# Patient Record
Sex: Female | Born: 1975 | Race: Black or African American | Hispanic: No | Marital: Married | Smoking: Never smoker
Health system: Southern US, Community
[De-identification: ages and names within clinical notes are randomized; demographics above are authoritative.]

## PROBLEM LIST (undated history)

## (undated) ENCOUNTER — Inpatient Hospital Stay (HOSPITAL_COMMUNITY): Payer: Self-pay

## (undated) DIAGNOSIS — D573 Sickle-cell trait: Secondary | ICD-10-CM

## (undated) DIAGNOSIS — M1990 Unspecified osteoarthritis, unspecified site: Secondary | ICD-10-CM

## (undated) DIAGNOSIS — K219 Gastro-esophageal reflux disease without esophagitis: Secondary | ICD-10-CM

## (undated) HISTORY — DX: Sickle-cell trait: D57.3

## (undated) HISTORY — DX: Gastro-esophageal reflux disease without esophagitis: K21.9

## (undated) HISTORY — DX: Unspecified osteoarthritis, unspecified site: M19.90

## (undated) HISTORY — DX: Morbid (severe) obesity due to excess calories: E66.01

---

## 2013-05-21 LAB — OB RESULTS CONSOLE GC/CHLAMYDIA
Chlamydia: NEGATIVE
Gonorrhea: NEGATIVE

## 2013-05-21 LAB — OB RESULTS CONSOLE HEPATITIS B SURFACE ANTIGEN: Hepatitis B Surface Ag: NEGATIVE

## 2013-05-21 LAB — SICKLE CELL SCREEN

## 2013-05-21 LAB — OB RESULTS CONSOLE ABO/RH

## 2013-05-21 LAB — OB RESULTS CONSOLE HGB/HCT, BLOOD: HCT: 38 %

## 2013-05-21 LAB — OB RESULTS CONSOLE PLATELET COUNT: Platelets: 196 10*3/uL

## 2013-05-21 LAB — OB RESULTS CONSOLE ANTIBODY SCREEN: Antibody Screen: NEGATIVE

## 2013-06-07 ENCOUNTER — Inpatient Hospital Stay (HOSPITAL_COMMUNITY): Admission: AD | Admit: 2013-06-07 | Payer: Self-pay | Source: Ambulatory Visit | Admitting: Obstetrics & Gynecology

## 2013-07-12 ENCOUNTER — Encounter: Payer: Self-pay | Admitting: *Deleted

## 2013-07-16 ENCOUNTER — Encounter: Payer: Self-pay | Admitting: Obstetrics & Gynecology

## 2013-07-16 ENCOUNTER — Ambulatory Visit (INDEPENDENT_AMBULATORY_CARE_PROVIDER_SITE_OTHER): Payer: Self-pay | Admitting: Obstetrics & Gynecology

## 2013-07-16 VITALS — BP 141/82 | Temp 97.2°F | Ht 64.0 in | Wt 235.6 lb

## 2013-07-16 DIAGNOSIS — O09212 Supervision of pregnancy with history of pre-term labor, second trimester: Secondary | ICD-10-CM

## 2013-07-16 DIAGNOSIS — O09219 Supervision of pregnancy with history of pre-term labor, unspecified trimester: Secondary | ICD-10-CM | POA: Insufficient documentation

## 2013-07-16 DIAGNOSIS — E669 Obesity, unspecified: Secondary | ICD-10-CM

## 2013-07-16 DIAGNOSIS — IMO0002 Reserved for concepts with insufficient information to code with codable children: Secondary | ICD-10-CM | POA: Insufficient documentation

## 2013-07-16 DIAGNOSIS — Z609 Problem related to social environment, unspecified: Secondary | ICD-10-CM

## 2013-07-16 DIAGNOSIS — O99212 Obesity complicating pregnancy, second trimester: Secondary | ICD-10-CM

## 2013-07-16 DIAGNOSIS — O09522 Supervision of elderly multigravida, second trimester: Secondary | ICD-10-CM

## 2013-07-16 DIAGNOSIS — O34219 Maternal care for unspecified type scar from previous cesarean delivery: Secondary | ICD-10-CM

## 2013-07-16 DIAGNOSIS — O99210 Obesity complicating pregnancy, unspecified trimester: Secondary | ICD-10-CM | POA: Insufficient documentation

## 2013-07-16 DIAGNOSIS — O09529 Supervision of elderly multigravida, unspecified trimester: Secondary | ICD-10-CM

## 2013-07-16 DIAGNOSIS — D573 Sickle-cell trait: Secondary | ICD-10-CM

## 2013-07-16 DIAGNOSIS — Z789 Other specified health status: Secondary | ICD-10-CM | POA: Insufficient documentation

## 2013-07-16 HISTORY — DX: Sickle-cell trait: D57.3

## 2013-07-16 LAB — POCT URINALYSIS DIP (DEVICE)
Bilirubin Urine: NEGATIVE
Glucose, UA: NEGATIVE mg/dL
Ketones, ur: NEGATIVE mg/dL
Nitrite: NEGATIVE
Specific Gravity, Urine: 1.015 (ref 1.005–1.030)
Urobilinogen, UA: 0.2 mg/dL (ref 0.0–1.0)
pH: 7 (ref 5.0–8.0)

## 2013-07-16 NOTE — Progress Notes (Signed)
Transfer from [facility] for PPROM/PTD at 33 weeks.  Candidate for 17P, patient wishes to get this but does not have insurance.  She is willing to apply for the patient assistance program. Patient desires TOLAC, consent given to her to review at home, will sign at next visit.  Recommended anatomy scan, patient does not want to get this in the hospital 2/2 cost.  Will get it somewhere and send the results.  Need quad screen results from [facility]. No other complaints or concerns.  Routine obstetric precautions reviewed.

## 2013-07-16 NOTE — Patient Instructions (Signed)
Return to clinic for any obstetric concerns or go to MAU for evaluation  

## 2013-07-16 NOTE — Progress Notes (Signed)
Pulse-  75 New ob packet given Flu vaccine given @ [facility]

## 2013-07-16 NOTE — Progress Notes (Signed)
Nutrition note: 1st visit consult Pt has gained 13.6# @ [redacted]w[redacted]d, which is > expected so far. Pt reports eating 3 meals & 1-2 snacks/d. Pt is taking PNV. Pt reports no N/V or heartburn. NKFA. Pt received verbal & written education on general nutrition during pregnancy. Discussed portion sizes.  Discussed wt gain goals of 11-20# or 0.5#/wk. Pt agrees to continue taking PNV. Pt has WIC and plans to BF. F/u if referred Blondell Reveal, MS, RD, LDN, [facility]

## 2013-07-20 ENCOUNTER — Encounter: Payer: Self-pay | Admitting: *Deleted

## 2013-07-20 DIAGNOSIS — O34219 Maternal care for unspecified type scar from previous cesarean delivery: Secondary | ICD-10-CM

## 2013-07-30 ENCOUNTER — Encounter: Payer: Self-pay | Admitting: Advanced Practice Midwife

## 2013-07-31 ENCOUNTER — Ambulatory Visit (INDEPENDENT_AMBULATORY_CARE_PROVIDER_SITE_OTHER): Payer: Self-pay | Admitting: Obstetrics & Gynecology

## 2013-07-31 VITALS — BP 116/70 | Temp 97.7°F | Wt 239.1 lb

## 2013-07-31 DIAGNOSIS — O09892 Supervision of other high risk pregnancies, second trimester: Secondary | ICD-10-CM

## 2013-07-31 DIAGNOSIS — O09219 Supervision of pregnancy with history of pre-term labor, unspecified trimester: Secondary | ICD-10-CM

## 2013-07-31 LAB — POCT URINALYSIS DIP (DEVICE)
Bilirubin Urine: NEGATIVE
Glucose, UA: NEGATIVE mg/dL
Hgb urine dipstick: NEGATIVE
Ketones, ur: NEGATIVE mg/dL
Leukocytes, UA: NEGATIVE
Nitrite: NEGATIVE
Specific Gravity, Urine: 1.015 (ref 1.005–1.030)
pH: 7.5 (ref 5.0–8.0)

## 2013-07-31 MED ORDER — HYDROXYPROGESTERONE CAPROATE 250 MG/ML IM OIL
250.0000 mg | TOPICAL_OIL | INTRAMUSCULAR | Status: AC
  Administered 2013-07-31 – 2013-10-24 (×13): 250 mg via INTRAMUSCULAR

## 2013-07-31 NOTE — Patient Instructions (Addendum)
Preterm Labor Information Preterm labor is when labor starts at less than 37 weeks of pregnancy. The normal length of a pregnancy is 39 to 41 weeks. CAUSES Often, there is no identifiable underlying cause as to why a woman goes into preterm labor. One of the most common known causes of preterm labor is infection. Infections of the uterus, cervix, vagina, amniotic sac, bladder, kidney, or even the lungs (pneumonia) can cause labor to start. Other suspected causes of preterm labor include:   Urogenital infections, such as yeast infections and bacterial vaginosis.   Uterine abnormalities (uterine shape, uterine septum, fibroids, or bleeding from the placenta).   A cervix that has been operated on (it may fail to stay closed).   Malformations in the fetus.   Multiple gestations (twins, triplets, and so on).   Breakage of the amniotic sac.  RISK FACTORS  Having a previous history of preterm labor.   Having premature rupture of membranes (PROM).   Having a placenta that covers the opening of the cervix (placenta previa).   Having a placenta that separates from the uterus (placental abruption).   Having a cervix that is too weak to hold the fetus in the uterus (incompetent cervix).   Having too much fluid in the amniotic sac (polyhydramnios).   Pt given written booklet on 17- OH-P   Taking illegal drugs or smoking while pregnant.   Not gaining enough weight while pregnant.   Being younger than 24 and older than 36 years old.   Having a low socioeconomic status.   Being African American. SYMPTOMS Signs and symptoms of preterm labor include:   Menstrual-like cramps, abdominal pain, or back pain.  Uterine contractions that are regular, as frequent as six in an hour, regardless of their intensity (may be mild or painful).  Contractions that start on the top of the uterus and spread down to the lower abdomen and back.   A sense of increased pelvic pressure.    A watery or bloody mucus discharge that comes from the vagina.  TREATMENT Depending on the length of the pregnancy and other circumstances, your health care provider may suggest bed rest. If necessary, there are medicines that can be given to stop contractions and to mature the fetal lungs. If labor happens before 34 weeks of pregnancy, a prolonged hospital stay may be recommended. Treatment depends on the condition of both you and the fetus.  WHAT SHOULD YOU DO IF YOU THINK YOU ARE IN PRETERM LABOR? Call your health care provider right away. You will need to go to the hospital to get checked immediately. HOW CAN YOU PREVENT PRETERM LABOR IN FUTURE PREGNANCIES? You should:   Stop smoking if you smoke.  Maintain healthy weight gain and avoid chemicals and drugs that are not necessary.  Be watchful for any type of infection.  Inform your health care provider if you have a known history of preterm labor. Document Released: 11/13/2003 Document Revised: 04/25/2013 Document Reviewed: 09/25/2012 [facility] Patient Information 2014 [place], [place].

## 2013-07-31 NOTE — Progress Notes (Signed)
Pt with no complaints.  Reviewed 17- OH-P.  Pt ti start today F/u weekly for injections F/u 4 weeks for OV

## 2013-07-31 NOTE — Progress Notes (Signed)
P = 87 

## 2013-08-07 ENCOUNTER — Ambulatory Visit (INDEPENDENT_AMBULATORY_CARE_PROVIDER_SITE_OTHER): Payer: Self-pay | Admitting: *Deleted

## 2013-08-07 VITALS — BP 129/74 | HR 91 | Temp 97.7°F

## 2013-08-07 DIAGNOSIS — O09219 Supervision of pregnancy with history of pre-term labor, unspecified trimester: Secondary | ICD-10-CM

## 2013-08-07 DIAGNOSIS — O09212 Supervision of pregnancy with history of pre-term labor, second trimester: Secondary | ICD-10-CM

## 2013-08-14 ENCOUNTER — Ambulatory Visit (INDEPENDENT_AMBULATORY_CARE_PROVIDER_SITE_OTHER): Payer: Self-pay | Admitting: *Deleted

## 2013-08-14 VITALS — BP 132/73 | HR 88 | Temp 98.9°F

## 2013-08-14 DIAGNOSIS — O09219 Supervision of pregnancy with history of pre-term labor, unspecified trimester: Secondary | ICD-10-CM

## 2013-08-14 DIAGNOSIS — O09212 Supervision of pregnancy with history of pre-term labor, second trimester: Secondary | ICD-10-CM

## 2013-08-21 ENCOUNTER — Ambulatory Visit (INDEPENDENT_AMBULATORY_CARE_PROVIDER_SITE_OTHER): Payer: Self-pay | Admitting: *Deleted

## 2013-08-21 VITALS — BP 129/72 | HR 93

## 2013-08-21 DIAGNOSIS — O09212 Supervision of pregnancy with history of pre-term labor, second trimester: Secondary | ICD-10-CM

## 2013-08-21 DIAGNOSIS — O09219 Supervision of pregnancy with history of pre-term labor, unspecified trimester: Secondary | ICD-10-CM

## 2013-08-29 ENCOUNTER — Ambulatory Visit (INDEPENDENT_AMBULATORY_CARE_PROVIDER_SITE_OTHER): Payer: Self-pay | Admitting: Family

## 2013-08-29 VITALS — BP 118/67 | Wt 250.9 lb

## 2013-08-29 DIAGNOSIS — O09522 Supervision of elderly multigravida, second trimester: Secondary | ICD-10-CM

## 2013-08-29 DIAGNOSIS — O34219 Maternal care for unspecified type scar from previous cesarean delivery: Secondary | ICD-10-CM

## 2013-08-29 DIAGNOSIS — O09219 Supervision of pregnancy with history of pre-term labor, unspecified trimester: Secondary | ICD-10-CM

## 2013-08-29 DIAGNOSIS — O09529 Supervision of elderly multigravida, unspecified trimester: Secondary | ICD-10-CM

## 2013-08-29 LAB — POCT URINALYSIS DIP (DEVICE)
Bilirubin Urine: NEGATIVE
Hgb urine dipstick: NEGATIVE
Ketones, ur: NEGATIVE mg/dL

## 2013-08-29 NOTE — Progress Notes (Signed)
Pulse: 82

## 2013-08-29 NOTE — Progress Notes (Signed)
No ultrasound on file, pt states got it completed at Dr. Elsie Stain office > pt concerned for additional cost of ultrasound.  Due to increased fundal height recommended follow-up for growth.  17 p today.

## 2013-09-04 ENCOUNTER — Ambulatory Visit (INDEPENDENT_AMBULATORY_CARE_PROVIDER_SITE_OTHER): Payer: Self-pay

## 2013-09-04 ENCOUNTER — Ambulatory Visit (HOSPITAL_COMMUNITY): Admission: RE | Admit: 2013-09-04 | Payer: Self-pay | Source: Ambulatory Visit

## 2013-09-04 DIAGNOSIS — O09219 Supervision of pregnancy with history of pre-term labor, unspecified trimester: Secondary | ICD-10-CM

## 2013-09-04 DIAGNOSIS — O09212 Supervision of pregnancy with history of pre-term labor, second trimester: Secondary | ICD-10-CM

## 2013-09-05 ENCOUNTER — Ambulatory Visit: Payer: Self-pay

## 2013-09-12 ENCOUNTER — Ambulatory Visit (INDEPENDENT_AMBULATORY_CARE_PROVIDER_SITE_OTHER): Payer: Self-pay | Admitting: Advanced Practice Midwife

## 2013-09-12 ENCOUNTER — Encounter: Payer: Self-pay | Admitting: *Deleted

## 2013-09-12 VITALS — BP 139/76 | Temp 98.8°F | Wt 251.5 lb

## 2013-09-12 DIAGNOSIS — O09529 Supervision of elderly multigravida, unspecified trimester: Secondary | ICD-10-CM

## 2013-09-12 DIAGNOSIS — O09522 Supervision of elderly multigravida, second trimester: Secondary | ICD-10-CM

## 2013-09-12 DIAGNOSIS — Z23 Encounter for immunization: Secondary | ICD-10-CM

## 2013-09-12 DIAGNOSIS — O09219 Supervision of pregnancy with history of pre-term labor, unspecified trimester: Secondary | ICD-10-CM

## 2013-09-12 DIAGNOSIS — O09213 Supervision of pregnancy with history of pre-term labor, third trimester: Secondary | ICD-10-CM

## 2013-09-12 LAB — CBC
HCT: 33.8 % — ABNORMAL LOW (ref 36.0–46.0)
Hemoglobin: 11.1 g/dL — ABNORMAL LOW (ref 12.0–15.0)
MCH: 29.4 pg (ref 26.0–34.0)
MCHC: 32.8 g/dL (ref 30.0–36.0)
MCV: 89.7 fL (ref 78.0–100.0)
Platelets: 193 10*3/uL (ref 150–400)
RBC: 3.77 MIL/uL — ABNORMAL LOW (ref 3.87–5.11)
RDW: 14.9 % (ref 11.5–15.5)
WBC: 8.5 10*3/uL (ref 4.0–10.5)

## 2013-09-12 MED ORDER — TETANUS-DIPHTH-ACELL PERTUSSIS 5-2.5-18.5 LF-MCG/0.5 IM SUSP: 0.5000 mL | Freq: Once | INTRAMUSCULAR | Status: DC

## 2013-09-12 NOTE — Progress Notes (Signed)
Doing well.  Good fetal movement, denies vaginal bleeding, LOF, regular contractions.  Pt has URI, no fever.  Discussed safe medications, pt given list today. Increase PO fluids. GTT today.

## 2013-09-12 NOTE — Progress Notes (Signed)
P=95, [employer] tradernterpreter

## 2013-09-13 LAB — GLUCOSE TOLERANCE, 1 HOUR (50G) W/O FASTING: Glucose, 1 Hour GTT: 111 mg/dL (ref 70–140)

## 2013-09-13 LAB — RPR

## 2013-09-13 LAB — HIV ANTIBODY (ROUTINE TESTING W REFLEX): HIV: NONREACTIVE

## 2013-09-14 ENCOUNTER — Other Ambulatory Visit: Payer: Self-pay | Admitting: Family

## 2013-09-14 ENCOUNTER — Ambulatory Visit (INDEPENDENT_AMBULATORY_CARE_PROVIDER_SITE_OTHER): Payer: Self-pay | Admitting: *Deleted

## 2013-09-14 ENCOUNTER — Ambulatory Visit (HOSPITAL_COMMUNITY)
Admission: RE | Admit: 2013-09-14 | Discharge: 2013-09-14 | Disposition: A | Discharge status: Home / Self Care | Payer: Self-pay | Source: Ambulatory Visit | Attending: Family | Admitting: Family

## 2013-09-14 VITALS — BP 145/78 | HR 87 | Temp 98.0°F

## 2013-09-14 DIAGNOSIS — O26873 Cervical shortening, third trimester: Secondary | ICD-10-CM

## 2013-09-14 DIAGNOSIS — O99210 Obesity complicating pregnancy, unspecified trimester: Principal | ICD-10-CM

## 2013-09-14 DIAGNOSIS — E669 Obesity, unspecified: Secondary | ICD-10-CM | POA: Insufficient documentation

## 2013-09-14 DIAGNOSIS — O26879 Cervical shortening, unspecified trimester: Secondary | ICD-10-CM

## 2013-09-14 DIAGNOSIS — O09522 Supervision of elderly multigravida, second trimester: Secondary | ICD-10-CM

## 2013-09-14 DIAGNOSIS — Z3689 Encounter for other specified antenatal screening: Secondary | ICD-10-CM | POA: Insufficient documentation

## 2013-09-14 MED ORDER — BETAMETHASONE SOD PHOS & ACET 6 (3-3) MG/ML IJ SUSP
12.0000 mg | Freq: Once | INTRAMUSCULAR | Status: AC
  Administered 2013-09-14: 12 mg via INTRAMUSCULAR

## 2013-09-14 NOTE — Progress Notes (Signed)
Patient ID: Deborah Huang, female   DOB: 1976-04-15, 38 y.o.   MRN: [id number] Short cervix noted on US today, will receive betamethasone 12 mg IM today and tomorrow. Continue 17 P

## 2013-09-15 ENCOUNTER — Inpatient Hospital Stay (HOSPITAL_COMMUNITY)
Admission: AD | Admit: 2013-09-15 | Discharge: 2013-09-15 | Disposition: A | Discharge status: Home / Self Care | Payer: Self-pay | Source: Ambulatory Visit | Attending: Obstetrics & Gynecology | Admitting: Obstetrics & Gynecology

## 2013-09-15 DIAGNOSIS — O4700 False labor before 37 completed weeks of gestation, unspecified trimester: Secondary | ICD-10-CM | POA: Insufficient documentation

## 2013-09-15 MED ORDER — BETAMETHASONE SOD PHOS & ACET 6 (3-3) MG/ML IJ SUSP
12.0000 mg | Freq: Once | INTRAMUSCULAR | Status: AC
  Administered 2013-09-15: 12 mg via INTRAMUSCULAR
  Filled 2013-09-15: qty 2

## 2013-09-16 ENCOUNTER — Encounter: Payer: Self-pay | Admitting: Family

## 2013-09-16 DIAGNOSIS — O26879 Cervical shortening, unspecified trimester: Secondary | ICD-10-CM | POA: Insufficient documentation

## 2013-09-17 LAB — POCT URINALYSIS DIP (DEVICE)
BILIRUBIN URINE: NEGATIVE
Glucose, UA: NEGATIVE mg/dL
Hgb urine dipstick: NEGATIVE
KETONES UR: NEGATIVE mg/dL
LEUKOCYTES UA: NEGATIVE
Nitrite: NEGATIVE
Protein, ur: NEGATIVE mg/dL
Specific Gravity, Urine: 1.02 (ref 1.005–1.030)
Urobilinogen, UA: 0.2 mg/dL (ref 0.0–1.0)
pH: 7 (ref 5.0–8.0)

## 2013-09-19 ENCOUNTER — Ambulatory Visit (INDEPENDENT_AMBULATORY_CARE_PROVIDER_SITE_OTHER): Payer: Self-pay | Admitting: *Deleted

## 2013-09-19 VITALS — BP 134/72 | HR 83 | Temp 98.4°F | Wt 249.7 lb

## 2013-09-19 DIAGNOSIS — O09219 Supervision of pregnancy with history of pre-term labor, unspecified trimester: Secondary | ICD-10-CM

## 2013-09-26 ENCOUNTER — Ambulatory Visit (INDEPENDENT_AMBULATORY_CARE_PROVIDER_SITE_OTHER): Payer: Self-pay | Admitting: Advanced Practice Midwife

## 2013-09-26 VITALS — BP 126/75 | Temp 97.5°F | Wt 250.2 lb

## 2013-09-26 DIAGNOSIS — O09219 Supervision of pregnancy with history of pre-term labor, unspecified trimester: Secondary | ICD-10-CM

## 2013-09-26 DIAGNOSIS — O26879 Cervical shortening, unspecified trimester: Secondary | ICD-10-CM

## 2013-09-26 LAB — POCT URINALYSIS DIP (DEVICE)
Bilirubin Urine: NEGATIVE
Glucose, UA: NEGATIVE mg/dL
Hgb urine dipstick: NEGATIVE
KETONES UR: NEGATIVE mg/dL
LEUKOCYTES UA: NEGATIVE
Nitrite: NEGATIVE
Protein, ur: NEGATIVE mg/dL
SPECIFIC GRAVITY, URINE: 1.015 (ref 1.005–1.030)
Urobilinogen, UA: 0.2 mg/dL (ref 0.0–1.0)
pH: 7 (ref 5.0–8.0)

## 2013-09-26 NOTE — Progress Notes (Signed)
p=95

## 2013-09-29 ENCOUNTER — Inpatient Hospital Stay (HOSPITAL_COMMUNITY)
Admission: AD | Admit: 2013-09-29 | Discharge: 2013-09-29 | Disposition: A | Discharge status: Home / Self Care | Payer: Self-pay | Source: Ambulatory Visit | Attending: Obstetrics and Gynecology | Admitting: Obstetrics and Gynecology

## 2013-09-29 ENCOUNTER — Inpatient Hospital Stay (HOSPITAL_COMMUNITY): Payer: Self-pay

## 2013-09-29 ENCOUNTER — Encounter (HOSPITAL_COMMUNITY): Payer: Self-pay

## 2013-09-29 DIAGNOSIS — O99019 Anemia complicating pregnancy, unspecified trimester: Secondary | ICD-10-CM | POA: Insufficient documentation

## 2013-09-29 DIAGNOSIS — O9989 Other specified diseases and conditions complicating pregnancy, childbirth and the puerperium: Secondary | ICD-10-CM

## 2013-09-29 DIAGNOSIS — W010XXA Fall on same level from slipping, tripping and stumbling without subsequent striking against object, initial encounter: Secondary | ICD-10-CM | POA: Insufficient documentation

## 2013-09-29 DIAGNOSIS — D573 Sickle-cell trait: Secondary | ICD-10-CM | POA: Insufficient documentation

## 2013-09-29 DIAGNOSIS — Y92009 Unspecified place in unspecified non-institutional (private) residence as the place of occurrence of the external cause: Secondary | ICD-10-CM | POA: Insufficient documentation

## 2013-09-29 DIAGNOSIS — W19XXXA Unspecified fall, initial encounter: Secondary | ICD-10-CM

## 2013-09-29 DIAGNOSIS — O368190 Decreased fetal movements, unspecified trimester, not applicable or unspecified: Secondary | ICD-10-CM | POA: Insufficient documentation

## 2013-09-29 DIAGNOSIS — O99891 Other specified diseases and conditions complicating pregnancy: Secondary | ICD-10-CM

## 2013-09-29 NOTE — MAU Note (Signed)
Reports she fell at home around 10 pm, her foot got caught up in a bag's strap and she fell on her abdominal area.  Wasn't feeling the baby as often since fall.

## 2013-09-29 NOTE — Discharge Instructions (Signed)
Preterm Labor Information Preterm labor is when labor starts at less than 37 weeks of pregnancy. The normal length of a pregnancy is 39 to 41 weeks. CAUSES Often, there is no identifiable underlying cause as to why a woman goes into preterm labor. One of the most common known causes of preterm labor is infection. Infections of the uterus, cervix, vagina, amniotic sac, bladder, kidney, or even the lungs (pneumonia) can cause labor to start. Other suspected causes of preterm labor include:   Urogenital infections, such as yeast infections and bacterial vaginosis.   Uterine abnormalities (uterine shape, uterine septum, fibroids, or bleeding from the placenta).   A cervix that has been operated on (it may fail to stay closed).   Malformations in the fetus.   Multiple gestations (twins, triplets, and so on).   Breakage of the amniotic sac.  RISK FACTORS  Having a previous history of preterm labor.   Having premature rupture of membranes (PROM).   Having a placenta that covers the opening of the cervix (placenta previa).   Having a placenta that separates from the uterus (placental abruption).   Having a cervix that is too weak to hold the fetus in the uterus (incompetent cervix).   Having too much fluid in the amniotic sac (polyhydramnios).   Taking illegal drugs or smoking while pregnant.   Not gaining enough weight while pregnant.   Being younger than 5618 and older than 37 years old.   Having a low socioeconomic status.   Being African American. SYMPTOMS Signs and symptoms of preterm labor include:   Menstrual-like cramps, abdominal pain, or back pain.  Uterine contractions that are regular, as frequent as six in an hour, regardless of their intensity (may be mild or painful).  Contractions that start on the top of the uterus and spread down to the lower abdomen and back.   A sense of increased pelvic pressure.   A watery or bloody mucus discharge that  comes from the vagina.  TREATMENT Depending on the length of the pregnancy and other circumstances, your health care provider may suggest bed rest. If necessary, there are medicines that can be given to stop contractions and to mature the fetal lungs. If labor happens before 34 weeks of pregnancy, a prolonged hospital stay may be recommended. Treatment depends on the condition of both you and the fetus.  WHAT SHOULD YOU DO IF YOU THINK YOU ARE IN PRETERM LABOR? Call your health care provider right away. You will need to go to the hospital to get checked immediately. HOW CAN YOU PREVENT PRETERM LABOR IN FUTURE PREGNANCIES? You should:   Stop smoking if you smoke.  Maintain healthy weight gain and avoid chemicals and drugs that are not necessary.  Be watchful for any type of infection.  Inform your health care provider if you have a known history of preterm labor. Document Released: 11/13/2003 Document Revised: 04/25/2013 Document Reviewed: 09/25/2012 [facility] Patient Information 2014 [place]. Fetal Movement Counts Patient Name: __________________________________________________ Patient Due Date: ____________________ Performing a fetal movement count is highly recommended in high-risk pregnancies, but it is good for every pregnant woman to do. Your caregiver may ask you to start counting fetal movements at 28 weeks of the pregnancy. Fetal movements often increase:  After eating a full meal.  After physical activity.  After eating or drinking something sweet or cold.  At rest. Pay attention to when you feel the baby is most active. This will help you notice a pattern of your baby's  sleep and wake cycles and what factors contribute to an increase in fetal movement. It is important to perform a fetal movement count at the same time each day when your baby is normally most active.  HOW TO COUNT FETAL MOVEMENTS 1. Find a quiet and comfortable area to sit or lie down on your left  side. Lying on your left side provides the best blood and oxygen circulation to your baby. 2. Write down the day and time on a sheet of paper or in a journal. 3. Start counting kicks, flutters, swishes, rolls, or jabs in a 2 hour period. You should feel at least 10 movements within 2 hours. 4. If you do not feel 10 movements in 2 hours, wait 2 3 hours and count again. Look for a change in the pattern or not enough counts in 2 hours. SEEK MEDICAL CARE IF:  You feel less than 10 counts in 2 hours, tried twice.  There is no movement in over an hour.  The pattern is changing or taking longer each day to reach 10 counts in 2 hours.  You feel the baby is not moving as he or she usually does. Date: ____________ Movements: ____________ Start time: ____________ Doreatha MartinFinish time: ____________  Date: ____________ Movements: ____________ Start time: ____________ Doreatha MartinFinish time: ____________ Date: ____________ Movements: ____________ Start time: ____________ Doreatha MartinFinish time: ____________ Date: ____________ Movements: ____________ Start time: ____________ Doreatha MartinFinish time: ____________ Date: ____________ Movements: ____________ Start time: ____________ Doreatha MartinFinish time: ____________ Date: ____________ Movements: ____________ Start time: ____________ Doreatha MartinFinish time: ____________ Date: ____________ Movements: ____________ Start time: ____________ Doreatha MartinFinish time: ____________ Date: ____________ Movements: ____________ Start time: ____________ Doreatha MartinFinish time: ____________  Date: ____________ Movements: ____________ Start time: ____________ Doreatha MartinFinish time: ____________ Date: ____________ Movements: ____________ Start time: ____________ Doreatha MartinFinish time: ____________ Date: ____________ Movements: ____________ Start time: ____________ Doreatha MartinFinish time: ____________ Date: ____________ Movements: ____________ Start time: ____________ Doreatha MartinFinish time: ____________ Date: ____________ Movements: ____________ Start time: ____________ Doreatha MartinFinish time:  ____________ Date: ____________ Movements: ____________ Start time: ____________ Doreatha MartinFinish time: ____________ Date: ____________ Movements: ____________ Start time: ____________ Doreatha MartinFinish time: ____________  Date: ____________ Movements: ____________ Start time: ____________ Doreatha MartinFinish time: ____________ Date: ____________ Movements: ____________ Start time: ____________ Doreatha MartinFinish time: ____________ Date: ____________ Movements: ____________ Start time: ____________ Doreatha MartinFinish time: ____________ Date: ____________ Movements: ____________ Start time: ____________ Doreatha MartinFinish time: ____________ Date: ____________ Movements: ____________ Start time: ____________ Doreatha MartinFinish time: ____________ Date: ____________ Movements: ____________ Start time: ____________ Doreatha MartinFinish time: ____________ Date: ____________ Movements: ____________ Start time: ____________ Doreatha MartinFinish time: ____________  Date: ____________ Movements: ____________ Start time: ____________ Doreatha MartinFinish time: ____________ Date: ____________ Movements: ____________ Start time: ____________ Doreatha MartinFinish time: ____________ Date: ____________ Movements: ____________ Start time: ____________ Doreatha MartinFinish time: ____________ Date: ____________ Movements: ____________ Start time: ____________ Doreatha MartinFinish time: ____________ Date: ____________ Movements: ____________ Start time: ____________ Doreatha MartinFinish time: ____________ Date: ____________ Movements: ____________ Start time: ____________ Doreatha MartinFinish time: ____________ Date: ____________ Movements: ____________ Start time: ____________ Doreatha MartinFinish time: ____________  Date: ____________ Movements: ____________ Start time: ____________ Doreatha MartinFinish time: ____________ Date: ____________ Movements: ____________ Start time: ____________ Doreatha MartinFinish time: ____________ Date: ____________ Movements: ____________ Start time: ____________ Doreatha MartinFinish time: ____________ Date: ____________ Movements: ____________ Start time: ____________ Doreatha MartinFinish time: ____________ Date: ____________ Movements:  ____________ Start time: ____________ Doreatha MartinFinish time: ____________ Date: ____________ Movements: ____________ Start time: ____________ Doreatha MartinFinish time: ____________ Date: ____________ Movements: ____________ Start time: ____________ Doreatha MartinFinish time: ____________  Date: ____________ Movements: ____________ Start time: ____________ Doreatha MartinFinish time: ____________ Date: ____________ Movements: ____________ Start time: ____________ Doreatha MartinFinish time: ____________ Date: ____________ Movements: ____________ Start time: ____________ Doreatha MartinFinish time: ____________  Date: ____________ Movements: ____________ Start time: ____________ Doreatha MartinFinish time: ____________ Date: ____________ Movements: ____________ Start time: ____________ Doreatha MartinFinish time: ____________ Date: ____________ Movements: ____________ Start time: ____________ Doreatha MartinFinish time: ____________ Date: ____________ Movements: ____________ Start time: ____________ Doreatha MartinFinish time: ____________  Date: ____________ Movements: ____________ Start time: ____________ Doreatha MartinFinish time: ____________ Date: ____________ Movements: ____________ Start time: ____________ Doreatha MartinFinish time: ____________ Date: ____________ Movements: ____________ Start time: ____________ Doreatha MartinFinish time: ____________ Date: ____________ Movements: ____________ Start time: ____________ Doreatha MartinFinish time: ____________ Date: ____________ Movements: ____________ Start time: ____________ Doreatha MartinFinish time: ____________ Date: ____________ Movements: ____________ Start time: ____________ Doreatha MartinFinish time: ____________ Date: ____________ Movements: ____________ Start time: ____________ Doreatha MartinFinish time: ____________  Date: ____________ Movements: ____________ Start time: ____________ Doreatha MartinFinish time: ____________ Date: ____________ Movements: ____________ Start time: ____________ Doreatha MartinFinish time: ____________ Date: ____________ Movements: ____________ Start time: ____________ Doreatha MartinFinish time: ____________ Date: ____________ Movements: ____________ Start time: ____________ Doreatha MartinFinish  time: ____________ Date: ____________ Movements: ____________ Start time: ____________ Doreatha MartinFinish time: ____________ Date: ____________ Movements: ____________ Start time: ____________ Doreatha MartinFinish time: ____________ Document Released: 09/22/2006 Document Revised: 08/09/2012 Document Reviewed: 06/19/2012 [employer] Patient Information 2014 [place].

## 2013-09-29 NOTE — MAU Note (Signed)
About 2230 fell forward and hit stomach on floor. Leaked ? Urine when i fell and haven't leaked anything since. Baby not moving like normal.

## 2013-09-29 NOTE — MAU Provider Note (Signed)
History     CSN: [id number]  Arrival date and time: 09/29/13 0150   None     Chief Complaint  Patient presents with  . Fall   HPI Pt is a 38 y.o. [id number] at [redacted]w[redacted]d who presents after falling on her abdomen around 10:30pm. She reports that she tripped and fell forward onto her belly. She denies any pain or other complaints. She did think that the fetal movement decreased after the accident which is what prompted her to come in. She denies bleeding or leakage of fluid. She denies ctx. She does not want to stay for 4 hours to be monitored.  OB History   Grav Para Term Preterm Abortions TAB SAB Ect Mult Living   4 3 2 1      3       Past Medical History  Diagnosis Date  . Arthritis   . Sickle cell trait 07/16/2013    Past Surgical History  Procedure Laterality Date  . Cesarean section      Family History  Problem Relation Age of Onset  . Hypertension Mother   . Diabetes Father     History  Substance Use Topics  . Smoking status: Never Smoker   . Smokeless tobacco: Never Used  . Alcohol Use: No    Allergies: No Known Allergies  Facility-administered medications prior to admission  Medication Dose Route Frequency Provider Last Rate Last Dose  . hydroxyprogesterone caproate (DELALUTIN) 250 mg/mL injection 250 mg  250 mg Intramuscular Weekly Willodean Rosenthal, MD   250 mg at 09/26/13 1049  . Tdap (BOOSTRIX) injection 0.5 mL  0.5 mL Intramuscular Once Hurshel Party, CNM       Prescriptions prior to admission  Medication Sig Dispense Refill  . Prenatal Vit-Fe Fumarate-FA (PRENATAL VITAMINS PLUS) 27-1 MG TABS Take 1 tablet by mouth daily.        Review of Systems  Constitutional: Negative for fever.  Gastrointestinal: Negative for abdominal pain.  Neurological: Negative for headaches.  All other systems reviewed and are negative.   Physical Exam   Blood pressure 143/72, pulse 91, temperature 98.2 F (36.8 C), resp. rate 20, height 5\' 4"  (1.626 m),  weight 115.758 kg (255 lb 3.2 oz), last menstrual period 02/27/2013.  Physical Exam  Nursing note and vitals reviewed. Constitutional: She is oriented to person, place, and time. She appears well-developed and well-nourished. No distress.  HENT:  Head: Normocephalic and atraumatic.  Right Ear: External ear normal.  Left Ear: External ear normal.  Eyes: Conjunctivae are normal. Right eye exhibits no discharge. Left eye exhibits no discharge. No scleral icterus.  Cardiovascular: Normal rate.   Respiratory: Effort normal.  GI: Soft. She exhibits no distension. There is no tenderness.  Musculoskeletal: Normal range of motion.  Neurological: She is alert and oriented to person, place, and time.  Skin: Skin is warm and dry. She is not diaphoretic.  Psychiatric: She has a normal mood and affect. Her behavior is normal.    MAU Course  Procedures  MDM Toco: no contractions  FWB: bl 160/mod var/ +accels/ one questionable decel, not associated with a ctx BPP: 8/8, AFI 16, breech presentation  Assessment and Plan  Pt is a 38 y.o. [id number] at [redacted]w[redacted]d who presents after falling on her abdomen. She denies any symptoms. Not contracting. Fetal tracing reactive. Monitor for 4 hours. BPP normal. Discharge home, return for decreased fetal movement, vaginal bleeding, LOF, regular or painful contractions.  Beverely Low 09/29/2013, 3:05 AM   I  have seen and examined this patient and I agree with the above. At discharge denies pain or ctx; no leaking or bldg. FHR with 10x10 accels. Cam HaiSHAW, KIMBERLY 8:01 AM 09/29/2013

## 2013-10-03 ENCOUNTER — Ambulatory Visit (HOSPITAL_COMMUNITY): Payer: Self-pay

## 2013-10-03 ENCOUNTER — Ambulatory Visit: Payer: Self-pay

## 2013-10-03 ENCOUNTER — Ambulatory Visit (INDEPENDENT_AMBULATORY_CARE_PROVIDER_SITE_OTHER): Payer: Self-pay

## 2013-10-03 VITALS — BP 123/76 | HR 93 | Temp 97.2°F

## 2013-10-03 DIAGNOSIS — O09219 Supervision of pregnancy with history of pre-term labor, unspecified trimester: Secondary | ICD-10-CM

## 2013-10-03 NOTE — MAU Provider Note (Signed)
`````  Attestation of Attending Supervision of Advanced Practitioner: Evaluation and management procedures were performed by the PA/NP/CNM/OB Fellow under my supervision/collaboration. Chart reviewed and agree with management and plan.  Jaaziel Peatross V 10/03/2013 4:48 PM

## 2013-10-09 ENCOUNTER — Encounter: Payer: Self-pay | Admitting: Obstetrics and Gynecology

## 2013-10-09 ENCOUNTER — Ambulatory Visit (HOSPITAL_COMMUNITY)
Admission: RE | Admit: 2013-10-09 | Discharge: 2013-10-09 | Disposition: A | Discharge status: Home / Self Care | Payer: Self-pay | Source: Ambulatory Visit | Attending: Advanced Practice Midwife | Admitting: Advanced Practice Midwife

## 2013-10-09 ENCOUNTER — Ambulatory Visit (INDEPENDENT_AMBULATORY_CARE_PROVIDER_SITE_OTHER): Payer: Self-pay | Admitting: Obstetrics and Gynecology

## 2013-10-09 VITALS — BP 125/69 | Temp 96.6°F | Wt 259.0 lb

## 2013-10-09 DIAGNOSIS — D573 Sickle-cell trait: Secondary | ICD-10-CM

## 2013-10-09 DIAGNOSIS — O26879 Cervical shortening, unspecified trimester: Secondary | ICD-10-CM

## 2013-10-09 DIAGNOSIS — O99210 Obesity complicating pregnancy, unspecified trimester: Secondary | ICD-10-CM

## 2013-10-09 DIAGNOSIS — O09219 Supervision of pregnancy with history of pre-term labor, unspecified trimester: Secondary | ICD-10-CM

## 2013-10-09 DIAGNOSIS — Z363 Encounter for antenatal screening for malformations: Secondary | ICD-10-CM | POA: Insufficient documentation

## 2013-10-09 DIAGNOSIS — E669 Obesity, unspecified: Secondary | ICD-10-CM

## 2013-10-09 DIAGNOSIS — Z1389 Encounter for screening for other disorder: Secondary | ICD-10-CM | POA: Insufficient documentation

## 2013-10-09 DIAGNOSIS — IMO0002 Reserved for concepts with insufficient information to code with codable children: Secondary | ICD-10-CM

## 2013-10-09 DIAGNOSIS — Z609 Problem related to social environment, unspecified: Secondary | ICD-10-CM

## 2013-10-09 DIAGNOSIS — O09529 Supervision of elderly multigravida, unspecified trimester: Secondary | ICD-10-CM | POA: Insufficient documentation

## 2013-10-09 DIAGNOSIS — O34219 Maternal care for unspecified type scar from previous cesarean delivery: Secondary | ICD-10-CM

## 2013-10-09 DIAGNOSIS — Z789 Other specified health status: Secondary | ICD-10-CM

## 2013-10-09 LAB — POCT URINALYSIS DIP (DEVICE)
Bilirubin Urine: NEGATIVE
Glucose, UA: NEGATIVE mg/dL
HGB URINE DIPSTICK: NEGATIVE
Ketones, ur: NEGATIVE mg/dL
Nitrite: NEGATIVE
Protein, ur: NEGATIVE mg/dL
SPECIFIC GRAVITY, URINE: 1.015 (ref 1.005–1.030)
Urobilinogen, UA: 0.2 mg/dL (ref 0.0–1.0)
pH: 7 (ref 5.0–8.0)

## 2013-10-09 NOTE — Progress Notes (Signed)
Patient is doing well without complaints. FM/PTL precautions reviewed. Continue weekly 17-P. Patient had growth ultrasound today but report is not available for review

## 2013-10-09 NOTE — Progress Notes (Signed)
Pulse: 83

## 2013-10-09 NOTE — Progress Notes (Signed)
Late Entry for 09/26/13:  US ordered for followup anatomy and cervical length.

## 2013-10-17 ENCOUNTER — Ambulatory Visit (INDEPENDENT_AMBULATORY_CARE_PROVIDER_SITE_OTHER): Payer: Self-pay | Admitting: *Deleted

## 2013-10-17 VITALS — BP 133/73 | HR 89 | Temp 97.3°F | Wt 259.5 lb

## 2013-10-17 DIAGNOSIS — O09219 Supervision of pregnancy with history of pre-term labor, unspecified trimester: Secondary | ICD-10-CM

## 2013-10-24 ENCOUNTER — Ambulatory Visit (INDEPENDENT_AMBULATORY_CARE_PROVIDER_SITE_OTHER): Payer: Self-pay | Admitting: Obstetrics and Gynecology

## 2013-10-24 VITALS — BP 134/77 | Wt 260.7 lb

## 2013-10-24 DIAGNOSIS — O26879 Cervical shortening, unspecified trimester: Secondary | ICD-10-CM

## 2013-10-24 DIAGNOSIS — D573 Sickle-cell trait: Secondary | ICD-10-CM

## 2013-10-24 LAB — POCT URINALYSIS DIP (DEVICE)
BILIRUBIN URINE: NEGATIVE
Glucose, UA: NEGATIVE mg/dL
HGB URINE DIPSTICK: NEGATIVE
Ketones, ur: NEGATIVE mg/dL
NITRITE: NEGATIVE
Protein, ur: NEGATIVE mg/dL
Specific Gravity, Urine: 1.015 (ref 1.005–1.030)
Urobilinogen, UA: 0.2 mg/dL (ref 0.0–1.0)
pH: 7 (ref 5.0–8.0)

## 2013-10-24 LAB — GLUCOSE, CAPILLARY: GLUCOSE-CAPILLARY: 82 mg/dL (ref 70–99)

## 2013-10-24 NOTE — Progress Notes (Signed)
Pulse: 102

## 2013-10-24 NOTE — Patient Instructions (Signed)
Preterm Labor Information Preterm labor is when labor starts at less than 37 weeks of pregnancy. The normal length of a pregnancy is 39 to 41 weeks. CAUSES Often, there is no identifiable underlying cause as to why a woman goes into preterm labor. One of the most common known causes of preterm labor is infection. Infections of the uterus, cervix, vagina, amniotic sac, bladder, kidney, or even the lungs (pneumonia) can cause labor to start. Other suspected causes of preterm labor include:   Urogenital infections, such as yeast infections and bacterial vaginosis.   Uterine abnormalities (uterine shape, uterine septum, fibroids, or bleeding from the placenta).   A cervix that has been operated on (it may fail to stay closed).   Malformations in the fetus.   Multiple gestations (twins, triplets, and so on).   Breakage of the amniotic sac.  RISK FACTORS  Having a previous history of preterm labor.   Having premature rupture of membranes (PROM).   Having a placenta that covers the opening of the cervix (placenta previa).   Having a placenta that separates from the uterus (placental abruption).   Having a cervix that is too weak to hold the fetus in the uterus (incompetent cervix).   Having too much fluid in the amniotic sac (polyhydramnios).   Taking illegal drugs or smoking while pregnant.   Not gaining enough weight while pregnant.   Being younger than 18 and older than 37 years old.   Having a low socioeconomic status.   Being African American. SYMPTOMS Signs and symptoms of preterm labor include:   Menstrual-like cramps, abdominal pain, or back pain.  Uterine contractions that are regular, as frequent as six in an hour, regardless of their intensity (may be mild or painful).  Contractions that start on the top of the uterus and spread down to the lower abdomen and back.   A sense of increased pelvic pressure.   A watery or bloody mucus discharge that  comes from the vagina.  TREATMENT Depending on the length of the pregnancy and other circumstances, your health care provider may suggest bed rest. If necessary, there are medicines that can be given to stop contractions and to mature the fetal lungs. If labor happens before 34 weeks of pregnancy, a prolonged hospital stay may be recommended. Treatment depends on the condition of both you and the fetus.  WHAT SHOULD YOU DO IF YOU THINK YOU ARE IN PRETERM LABOR? Call your health care provider right away. You will need to go to the hospital to get checked immediately. HOW CAN YOU PREVENT PRETERM LABOR IN FUTURE PREGNANCIES? You should:   Stop smoking if you smoke.  Maintain healthy weight gain and avoid chemicals and drugs that are not necessary.  Be watchful for any type of infection.  Inform your health care provider if you have a known history of preterm labor. Document Released: 11/13/2003 Document Revised: 04/25/2013 Document Reviewed: 09/25/2012 ExitCare Patient Information 2014 [employer].    

## 2013-10-24 NOTE — Addendum Note (Signed)
Addended by: Franchot MimesALFARO, Nariah Morgano on: 10/24/2013 10:59 AM   Modules accepted: Orders

## 2013-10-24 NOTE — Progress Notes (Signed)
Short cx: could not assess CL at 32 wk scan. Growth 84th, AC 96th; AFI 22> CBG today (about 3 hr pp): (1/15 1 hr 111). S>D consider F/U US next. No sx PTL> PTL precautions. 17-P today. Urine culture sent. Pt to ask about FOB sickle status.

## 2013-10-25 LAB — CULTURE, OB URINE

## 2013-11-01 ENCOUNTER — Ambulatory Visit: Payer: Self-pay

## 2013-11-02 ENCOUNTER — Ambulatory Visit: Payer: Self-pay

## 2013-11-08 ENCOUNTER — Ambulatory Visit (INDEPENDENT_AMBULATORY_CARE_PROVIDER_SITE_OTHER): Payer: Self-pay | Admitting: Family

## 2013-11-08 VITALS — BP 124/74 | Wt 263.3 lb

## 2013-11-08 DIAGNOSIS — O09219 Supervision of pregnancy with history of pre-term labor, unspecified trimester: Secondary | ICD-10-CM

## 2013-11-08 DIAGNOSIS — O34219 Maternal care for unspecified type scar from previous cesarean delivery: Secondary | ICD-10-CM

## 2013-11-08 LAB — POCT URINALYSIS DIP (DEVICE)
BILIRUBIN URINE: NEGATIVE
Glucose, UA: NEGATIVE mg/dL
HGB URINE DIPSTICK: NEGATIVE
Ketones, ur: NEGATIVE mg/dL
Leukocytes, UA: NEGATIVE
NITRITE: NEGATIVE
PROTEIN: NEGATIVE mg/dL
Specific Gravity, Urine: 1.01 (ref 1.005–1.030)
Urobilinogen, UA: 0.2 mg/dL (ref 0.0–1.0)
pH: 7 (ref 5.0–8.0)

## 2013-11-08 LAB — OB RESULTS CONSOLE GBS: GBS: NEGATIVE

## 2013-11-08 LAB — OB RESULTS CONSOLE GC/CHLAMYDIA
CHLAMYDIA, DNA PROBE: NEGATIVE
Gonorrhea: NEGATIVE

## 2013-11-08 NOTE — Progress Notes (Signed)
Pulse: 82

## 2013-11-08 NOTE — Progress Notes (Signed)
U/S scheduled 11/15/13 at 1130 am.

## 2013-11-08 NOTE — Progress Notes (Signed)
Reports feeling intermittent braxton hicks; no bleeding or leaking of fluid.  GBS and GC/CT collected.  Noted varicose veins on left inner lower leg, recommended compression hose.  Fundal height 40 cm > repeat growth US next week.

## 2013-11-09 LAB — GC/CHLAMYDIA PROBE AMP
CT Probe RNA: NEGATIVE
GC Probe RNA: NEGATIVE

## 2013-11-13 LAB — CULTURE, BETA STREP (GROUP B ONLY)

## 2013-11-15 ENCOUNTER — Encounter: Payer: Self-pay | Admitting: Obstetrics & Gynecology

## 2013-11-15 ENCOUNTER — Ambulatory Visit (HOSPITAL_COMMUNITY)
Admission: RE | Admit: 2013-11-15 | Discharge: 2013-11-15 | Disposition: A | Discharge status: Home / Self Care | Payer: Self-pay | Source: Ambulatory Visit | Attending: Family | Admitting: Family

## 2013-11-15 ENCOUNTER — Encounter: Payer: Self-pay | Admitting: Family

## 2013-11-15 ENCOUNTER — Ambulatory Visit (INDEPENDENT_AMBULATORY_CARE_PROVIDER_SITE_OTHER): Payer: Self-pay | Admitting: Obstetrics & Gynecology

## 2013-11-15 VITALS — BP 136/80 | Wt 262.1 lb

## 2013-11-15 DIAGNOSIS — O09529 Supervision of elderly multigravida, unspecified trimester: Secondary | ICD-10-CM | POA: Insufficient documentation

## 2013-11-15 DIAGNOSIS — O3430 Maternal care for cervical incompetence, unspecified trimester: Secondary | ICD-10-CM

## 2013-11-15 DIAGNOSIS — E669 Obesity, unspecified: Secondary | ICD-10-CM | POA: Insufficient documentation

## 2013-11-15 DIAGNOSIS — O34219 Maternal care for unspecified type scar from previous cesarean delivery: Secondary | ICD-10-CM

## 2013-11-15 DIAGNOSIS — O26879 Cervical shortening, unspecified trimester: Secondary | ICD-10-CM | POA: Insufficient documentation

## 2013-11-15 DIAGNOSIS — O99210 Obesity complicating pregnancy, unspecified trimester: Secondary | ICD-10-CM

## 2013-11-15 LAB — POCT URINALYSIS DIP (DEVICE)
BILIRUBIN URINE: NEGATIVE
GLUCOSE, UA: NEGATIVE mg/dL
Hgb urine dipstick: NEGATIVE
KETONES UR: NEGATIVE mg/dL
Leukocytes, UA: NEGATIVE
Nitrite: NEGATIVE
Protein, ur: NEGATIVE mg/dL
Specific Gravity, Urine: 1.01 (ref 1.005–1.030)
Urobilinogen, UA: 0.2 mg/dL (ref 0.0–1.0)
pH: 7 (ref 5.0–8.0)

## 2013-11-15 NOTE — Patient Instructions (Signed)
Natural Childbirth °Natural childbirth is going through labor and delivery without any drugs to relieve pain. You also do not use fetal monitors, have a cesarean delivery, or get a sugical cut to enlarge the vaginal opening (episiotomy). With the help of a birthing professional (midwife), you will direct your own labor and delivery as you choose. °Many women chose natural childbirth because they feel more in control and in touch with their labor and delivery. They are also concerned about the medications affecting themselves and the baby. °Pregnant women with a high risk pregnancy should not attempt natural childbirth. It is better to deliver the infant in a hospital if an emergency situation arises. Sometimes, the caregiver has to intervene for the health and safety of the mother and infant. °TWO TECHNIQUES FOR NATURAL CHILDBIRTH:  °· The Lamaze method. This method teaches women that having a baby is normal, healthy, and natural. It also teaches the mother to take a neutral position regarding pain medication and anesthesia and to make an informed decision if and when it is right for them. °· The Bradley method (also called husband coached birth). This method teaches the father to be the birth coach and stresses a natural approach. It also encourages exercise and a balanced diet with good nutrition. The exercises teach relaxation and deep breathing techniques. However, there are also classes to prepare the parents for an emergency situation that may occur. °METHODS OF DEALING WITH LABOR PAIN AND DELIVERY: °· Meditation. °· Yoga. °· Hypnosis. °· Acupuncture. °· Massage. °· Changing positions (walking, rocking, showering, leaning on birth balls). °· Lying in warm water or a jacuzzi. °· Find an activity that keeps your mind off of the labor pain. °· Listen to soft music. °· Visual imagery (focus on a particular object). °BEFORE GOING INTO LABOR °· Be sure you and your spouse/partner are in agreement to have natural  childbirth. °· Decide if your caregiver or a midwife will deliver your baby. °· Decide if you will have your baby in the hospital, birthing center, or at home. °· If you have children, make plans to have someone to take care of them when you go to the hospital. °· Know the distance and the time it takes to go to the delivery center. Make a dry run to be sure. °· Have a bag packed with a night gown, bathrobe, and toiletries ready to take when you go into labor. °· Keep phone numbers of your family and friends handy if you need to call someone when you go into labor. °· Your spouse or partner should go to all the teaching classes. °· Talk with your caregiver about the possibility of a medical emergency and what will happen if that occurs. °ADVANTAGES OF NATURAL CHILDBIRTH °· You are in control of your labor and delivery. °· It is safe. °· There are no medications or anesthetics that may affect you and the fetus. °· There are no invasive procedures such as an episiotomy. °· You and your partner will work together, which can increase your bond. °· Meditation, yoga, massage, and breathing exercises can be learned while pregnant and help you when you are in labor and at delivery. °· In most delivery centers, the family and friends can be involved in the labor and delivery process. °DISADVANTAGES OF NATURAL CHILDBIRTH °· You will experience pain during your labor and delivery. °· The methods of helping relieve your labor pains may not work for you. °· You may feel embarrassed, disappointed, and like a failure   if you decide to change your mind during labor and not have natural childbirth. °AFTER THE DELIVERY °· You will be very tired. °· You will be uncomfortable because of your uterus contracting. You will feel soreness around the vagina. °· You may feel cold and shaky.This is a natural reaction. °· You will be excited, overwhelmed, accomplished, and proud to be a mother. °HOME CARE INSTRUCTIONS  °· Follow the advice and  instructions of your caregiver. °· Follow the instructions of your natural childbirth instructor (Lamaze or Bradley Method). °Document Released: 08/05/2008 Document Revised: 11/15/2011 Document Reviewed: 04/30/2013 °ExitCare® Patient Information ©2014 [employer]. ° °

## 2013-11-15 NOTE — Progress Notes (Signed)
Pulse: 90

## 2013-11-15 NOTE — Progress Notes (Signed)
Pt c/o ctx.  No LOF or VB.  S/p Cerclage with removal F/u 1 week

## 2013-11-22 ENCOUNTER — Encounter: Payer: Self-pay | Admitting: Obstetrics & Gynecology

## 2013-11-22 ENCOUNTER — Ambulatory Visit (INDEPENDENT_AMBULATORY_CARE_PROVIDER_SITE_OTHER): Payer: Self-pay | Admitting: Obstetrics & Gynecology

## 2013-11-22 VITALS — BP 123/73 | Wt 264.6 lb

## 2013-11-22 DIAGNOSIS — O09529 Supervision of elderly multigravida, unspecified trimester: Secondary | ICD-10-CM

## 2013-11-22 DIAGNOSIS — O34219 Maternal care for unspecified type scar from previous cesarean delivery: Secondary | ICD-10-CM

## 2013-11-22 DIAGNOSIS — O09219 Supervision of pregnancy with history of pre-term labor, unspecified trimester: Secondary | ICD-10-CM

## 2013-11-22 DIAGNOSIS — O99214 Obesity complicating childbirth: Secondary | ICD-10-CM

## 2013-11-22 DIAGNOSIS — E669 Obesity, unspecified: Secondary | ICD-10-CM

## 2013-11-22 DIAGNOSIS — Z3480 Encounter for supervision of other normal pregnancy, unspecified trimester: Secondary | ICD-10-CM

## 2013-11-22 LAB — POCT URINALYSIS DIP (DEVICE)
BILIRUBIN URINE: NEGATIVE
GLUCOSE, UA: NEGATIVE mg/dL
Hgb urine dipstick: NEGATIVE
Ketones, ur: NEGATIVE mg/dL
NITRITE: NEGATIVE
Protein, ur: NEGATIVE mg/dL
Specific Gravity, Urine: 1.015 (ref 1.005–1.030)
Urobilinogen, UA: 0.2 mg/dL (ref 0.0–1.0)
pH: 7 (ref 5.0–8.0)

## 2013-11-22 NOTE — Patient Instructions (Signed)

## 2013-11-22 NOTE — Progress Notes (Signed)
Pulse: 85

## 2013-11-22 NOTE — Progress Notes (Signed)
No increase in ctx.  No bleeding. +FM. No LOF

## 2013-11-24 ENCOUNTER — Encounter (HOSPITAL_COMMUNITY): Payer: Medicaid Other | Admitting: Anesthesiology

## 2013-11-24 ENCOUNTER — Encounter (HOSPITAL_COMMUNITY): Payer: Self-pay | Admitting: Family

## 2013-11-24 ENCOUNTER — Inpatient Hospital Stay (HOSPITAL_COMMUNITY): Payer: Medicaid Other | Admitting: Anesthesiology

## 2013-11-24 ENCOUNTER — Inpatient Hospital Stay (HOSPITAL_COMMUNITY)
Admission: AD | Admit: 2013-11-24 | Discharge: 2013-11-27 | DRG: 765 | Disposition: A | Discharge status: Home / Self Care | Payer: Medicaid Other | Source: Ambulatory Visit | Attending: Obstetrics & Gynecology | Admitting: Obstetrics & Gynecology

## 2013-11-24 DIAGNOSIS — O34219 Maternal care for unspecified type scar from previous cesarean delivery: Secondary | ICD-10-CM

## 2013-11-24 DIAGNOSIS — O139 Gestational [pregnancy-induced] hypertension without significant proteinuria, unspecified trimester: Secondary | ICD-10-CM

## 2013-11-24 DIAGNOSIS — Z3490 Encounter for supervision of normal pregnancy, unspecified, unspecified trimester: Secondary | ICD-10-CM

## 2013-11-24 DIAGNOSIS — Z98891 History of uterine scar from previous surgery: Secondary | ICD-10-CM

## 2013-11-24 DIAGNOSIS — IMO0001 Reserved for inherently not codable concepts without codable children: Secondary | ICD-10-CM

## 2013-11-24 DIAGNOSIS — O09219 Supervision of pregnancy with history of pre-term labor, unspecified trimester: Secondary | ICD-10-CM

## 2013-11-24 DIAGNOSIS — Z789 Other specified health status: Secondary | ICD-10-CM

## 2013-11-24 LAB — URINALYSIS, ROUTINE W REFLEX MICROSCOPIC
Bilirubin Urine: NEGATIVE
Glucose, UA: NEGATIVE mg/dL
HGB URINE DIPSTICK: NEGATIVE
Ketones, ur: NEGATIVE mg/dL
NITRITE: NEGATIVE
PH: 6 (ref 5.0–8.0)
Protein, ur: NEGATIVE mg/dL
SPECIFIC GRAVITY, URINE: 1.01 (ref 1.005–1.030)
UROBILINOGEN UA: 0.2 mg/dL (ref 0.0–1.0)

## 2013-11-24 LAB — CBC
HCT: 34.4 % — ABNORMAL LOW (ref 36.0–46.0)
HEMOGLOBIN: 11.8 g/dL — AB (ref 12.0–15.0)
MCH: 29.8 pg (ref 26.0–34.0)
MCHC: 34.3 g/dL (ref 30.0–36.0)
MCV: 86.9 fL (ref 78.0–100.0)
Platelets: 204 10*3/uL (ref 150–400)
RBC: 3.96 MIL/uL (ref 3.87–5.11)
RDW: 14.8 % (ref 11.5–15.5)
WBC: 6.3 10*3/uL (ref 4.0–10.5)

## 2013-11-24 LAB — RPR: RPR Ser Ql: NONREACTIVE

## 2013-11-24 LAB — COMPREHENSIVE METABOLIC PANEL
ALT: 45 U/L — AB (ref 0–35)
AST: 28 U/L (ref 0–37)
Albumin: 2.9 g/dL — ABNORMAL LOW (ref 3.5–5.2)
Alkaline Phosphatase: 106 U/L (ref 39–117)
BILIRUBIN TOTAL: 0.3 mg/dL (ref 0.3–1.2)
BUN: 6 mg/dL (ref 6–23)
CHLORIDE: 101 meq/L (ref 96–112)
CO2: 21 mEq/L (ref 19–32)
Calcium: 9.5 mg/dL (ref 8.4–10.5)
Creatinine, Ser: 0.54 mg/dL (ref 0.50–1.10)
GFR calc Af Amer: >90 mL/min (ref >90)
GFR calc non Af Amer: >90 mL/min (ref >90)
Glucose, Bld: 94 mg/dL (ref 70–99)
Potassium: 4.1 mEq/L (ref 3.7–5.3)
SODIUM: 137 meq/L (ref 137–147)
TOTAL PROTEIN: 6.2 g/dL (ref 6.0–8.3)

## 2013-11-24 LAB — PROTEIN / CREATININE RATIO, URINE
Creatinine, Urine: 45.49 mg/dL
Protein Creatinine Ratio: 0.14 (ref 0.00–0.15)
TOTAL PROTEIN, URINE: 6.4 mg/dL

## 2013-11-24 LAB — ABO/RH: ABO/RH(D): O POS

## 2013-11-24 LAB — TYPE AND SCREEN
ABO/RH(D): O POS
Antibody Screen: NEGATIVE

## 2013-11-24 LAB — URINE MICROSCOPIC-ADD ON

## 2013-11-24 MED ORDER — ACETAMINOPHEN 325 MG PO TABS: 650.0000 mg | ORAL_TABLET | ORAL | Status: DC | PRN

## 2013-11-24 MED ORDER — CITRIC ACID-SODIUM CITRATE 334-500 MG/5ML PO SOLN
30.0000 mL | ORAL | Status: DC | PRN
  Administered 2013-11-25: 30 mL via ORAL
  Filled 2013-11-24: qty 15

## 2013-11-24 MED ORDER — LACTATED RINGERS IV SOLN
INTRAVENOUS | Status: DC
  Administered 2013-11-24: 15:00:00 via INTRAVENOUS

## 2013-11-24 MED ORDER — LIDOCAINE HCL (PF) 1 % IJ SOLN
INTRAMUSCULAR | Status: DC | PRN
  Administered 2013-11-24 (×4): 4 mL

## 2013-11-24 MED ORDER — DIPHENHYDRAMINE HCL 50 MG/ML IJ SOLN: 12.5000 mg | INTRAMUSCULAR | Status: DC | PRN

## 2013-11-24 MED ORDER — OXYTOCIN 40 UNITS IN LACTATED RINGERS INFUSION - SIMPLE MED
62.5000 mL/h | INTRAVENOUS | Status: DC
Start: 2013-11-24 — End: 2013-11-25
  Filled 2013-11-24: qty 1000

## 2013-11-24 MED ORDER — LIDOCAINE HCL (PF) 1 % IJ SOLN: 30.0000 mL | INTRAMUSCULAR | Status: DC | PRN

## 2013-11-24 MED ORDER — OXYTOCIN 40 UNITS IN LACTATED RINGERS INFUSION - SIMPLE MED: 1.0000 m[IU]/min | INTRAVENOUS | Status: DC

## 2013-11-24 MED ORDER — FENTANYL 2.5 MCG/ML BUPIVACAINE 1/10 % EPIDURAL INFUSION (WH - ANES)
14.0000 mL/h | INTRAMUSCULAR | Status: DC | PRN
  Administered 2013-11-24: 14 mL/h via EPIDURAL
  Filled 2013-11-24: qty 125

## 2013-11-24 MED ORDER — EPHEDRINE 5 MG/ML INJ
10.0000 mg | INTRAVENOUS | Status: DC | PRN
  Filled 2013-11-24: qty 4

## 2013-11-24 MED ORDER — IBUPROFEN 600 MG PO TABS: 600.0000 mg | ORAL_TABLET | Freq: Four times a day (QID) | ORAL | Status: DC | PRN

## 2013-11-24 MED ORDER — LACTATED RINGERS IV SOLN: 500.0000 mL | INTRAVENOUS | Status: DC | PRN

## 2013-11-24 MED ORDER — PHENYLEPHRINE 40 MCG/ML (10ML) SYRINGE FOR IV PUSH (FOR BLOOD PRESSURE SUPPORT): 80.0000 ug | PREFILLED_SYRINGE | INTRAVENOUS | Status: DC | PRN

## 2013-11-24 MED ORDER — EPHEDRINE 5 MG/ML INJ
10.0000 mg | INTRAVENOUS | Status: DC | PRN
Start: 2013-11-24 — End: 2013-11-25

## 2013-11-24 MED ORDER — LACTATED RINGERS IV SOLN: 500.0000 mL | Freq: Once | INTRAVENOUS | Status: DC

## 2013-11-24 MED ORDER — OXYTOCIN BOLUS FROM INFUSION: 500.0000 mL | INTRAVENOUS | Status: DC

## 2013-11-24 MED ORDER — OXYTOCIN 40 UNITS IN LACTATED RINGERS INFUSION - SIMPLE MED
1.0000 m[IU]/min | INTRAVENOUS | Status: DC
  Administered 2013-11-24: 2 m[IU]/min via INTRAVENOUS
  Administered 2013-11-24: 4 m[IU]/min via INTRAVENOUS

## 2013-11-24 MED ORDER — TERBUTALINE SULFATE 1 MG/ML IJ SOLN
0.2500 mg | Freq: Once | INTRAMUSCULAR | Status: AC | PRN
  Administered 2013-11-25: 0.25 mg via SUBCUTANEOUS

## 2013-11-24 MED ORDER — ONDANSETRON HCL 4 MG/2ML IJ SOLN: 4.0000 mg | Freq: Four times a day (QID) | INTRAMUSCULAR | Status: DC | PRN

## 2013-11-24 MED ORDER — FLEET ENEMA 7-19 GM/118ML RE ENEM: 1.0000 | ENEMA | RECTAL | Status: DC | PRN

## 2013-11-24 MED ORDER — OXYCODONE-ACETAMINOPHEN 5-325 MG PO TABS: 1.0000 | ORAL_TABLET | ORAL | Status: DC | PRN

## 2013-11-24 MED ORDER — PHENYLEPHRINE 40 MCG/ML (10ML) SYRINGE FOR IV PUSH (FOR BLOOD PRESSURE SUPPORT)
80.0000 ug | PREFILLED_SYRINGE | INTRAVENOUS | Status: DC | PRN
  Filled 2013-11-24: qty 10

## 2013-11-24 NOTE — Progress Notes (Signed)
Deborah Huang is a 38 y.o. [phone] at 5541w4d by ultrasound admitted for active labor  Subjective: Requesting epidural  Objective: BP 144/81  Pulse 101  Temp(Src) 98.5 F (36.9 C) (Oral)  Resp 18  Ht 5\' 3"  (1.6 m)  Wt 120.26 kg (265 lb 2 oz)  BMI 46.98 kg/m2  LMP 02/27/2013      FHT:  FHR: 135 bpm, variability: moderate,  accelerations:  Present,  decelerations:  Absent UC:   regular, every 1.5 minutes SVE:   Dilation: 4 Effacement (%): 90 Station: -2 Exam by:: GPayne, RN  Labs: Lab Results  Component Value Date   WBC 6.3 11/24/2013   HGB 11.8* 11/24/2013   HCT 34.4* 11/24/2013   MCV 86.9 11/24/2013   PLT 204 11/24/2013    Assessment / Plan: Spontaneous labor, progressing normally  Labor: Progressing normally Preeclampsia:  n/a Fetal Wellbeing:  Category I Pain Control:  Epidural I/D:  n/a Anticipated MOD:  NSVD  Deborah Huang 11/24/2013, 9:40 PM

## 2013-11-24 NOTE — Anesthesia Procedure Notes (Signed)
Epidural Patient location during procedure: OB Start time: 11/24/2013 9:11 PM  Staffing Performed by: anesthesiologist   Preanesthetic Checklist Completed: patient identified, site marked, surgical consent, pre-op evaluation, timeout performed, IV checked, risks and benefits discussed and monitors and equipment checked  Epidural Patient position: sitting Prep: site prepped and draped and DuraPrep Patient monitoring: continuous pulse ox and blood pressure Approach: midline Injection technique: LOR air  Needle:  Needle type: Tuohy  Needle gauge: 17 G Needle length: 9 cm and 9 Needle insertion depth: 6 cm Catheter type: closed end flexible Catheter size: 19 Gauge Catheter at skin depth: 11 cm Test dose: negative  Assessment Events: blood not aspirated, injection not painful, no injection resistance, negative IV test and no paresthesia  Additional Notes Discussed risk of headache, infection, bleeding, nerve injury and failed or incomplete block.  Patient voices understanding and wishes to proceed.  Epidural placed easily on first attempt.  No paresthesia.  Patient tolerated procedure well with no apparent complications.  Jasmine DecemberA. Coleman Kalas, MDReason for block:procedure for pain

## 2013-11-24 NOTE — Anesthesia Preprocedure Evaluation (Addendum)
Anesthesia Evaluation  Patient identified by MRN, date of birth, ID band Patient awake    Reviewed: Allergy & Precautions, H&P , NPO status , Patient's Chart, lab work & pertinent test results, reviewed documented beta blocker date and time   History of Anesthesia Complications Negative for: history of anesthetic complications  Airway Mallampati: IV TM Distance: >3 FB Neck ROM: full    Dental  (+) Teeth Intact   Pulmonary neg pulmonary ROS,  breath sounds clear to auscultation        Cardiovascular hypertension (GHTN), Rhythm:regular Rate:Normal     Neuro/Psych negative neurological ROS  negative psych ROS   GI/Hepatic negative GI ROS, Neg liver ROS,   Endo/Other  Morbid obesity  Renal/GU negative Renal ROS     Musculoskeletal   Abdominal   Peds  Hematology  (+) Sickle cell trait ,   Anesthesia Other Findings   Reproductive/Obstetrics (+) Pregnancy (h/o c/s x1, attempting VBAC --> STAT C/S for NRFS)                          Anesthesia Physical Anesthesia Plan  ASA: III and emergent  Anesthesia Plan: Epidural   Post-op Pain Management:    Induction:   Airway Management Planned:   Additional Equipment:   Intra-op Plan:   Post-operative Plan:   Informed Consent: I have reviewed the patients History and Physical, chart, labs and discussed the procedure including the risks, benefits and alternatives for the proposed anesthesia with the patient or authorized representative who has indicated his/her understanding and acceptance.     Plan Discussed with: Surgeon and CRNA  Anesthesia Plan Comments:        Anesthesia Quick Evaluation

## 2013-11-24 NOTE — MAU Note (Signed)
[name], RN given pt report. Pt to go to room 163.

## 2013-11-24 NOTE — MAU Note (Signed)
Pt here for contractions every 10 minutes. Has not been dilated. Denies bleeding or lof.

## 2013-11-24 NOTE — H&P (Signed)
LABOR ADMISSION HISTORY AND PHYSICAL   Edwyna ShellYawa Hardman is a 38 y.o. female [phone] with IUP at 249w4d by 12/04/13 presenting for contractions.  Pt states that she has been contracting since yesterday.  Today they became 8-310min apart and more painful. No LOF, vb. +FM.   No  HA, vision changes, LEE.   [facility] Has been on 17-P due to hx of PPROM at 33 weeks.  2 vaginal deliveries and most recent delivery via c/s for footling breech.  Labs unremarkable.   Prenatal History/Complications:  Past Medical History: Past Medical History  Diagnosis Date  . Arthritis   . Sickle cell trait 07/16/2013    Past Surgical History: Past Surgical History  Procedure Laterality Date  . Cesarean section      Obstetrical History: OB History   Grav Para Term Preterm Abortions TAB SAB Ect Mult Living   4 3 2 1      3     G1- NSVD, 5lb 7oz G20 NSVD, 6lb G3- LTCS for footling breech. 5lbs G4- current  Social History: History   Social History  . Marital Status: Married    Spouse Name: N/A    Number of Children: N/A  . Years of Education: N/A   Social History Main Topics  . Smoking status: Never Smoker   . Smokeless tobacco: Never Used  . Alcohol Use: No  . Drug Use: No  . Sexual Activity: Yes   Other Topics Concern  . None   Social History Narrative  . None    Family History: Family History  Problem Relation Age of Onset  . Hypertension Mother   . Diabetes Father     Allergies: No Known Allergies  Prescriptions prior to admission  Medication Sig Dispense Refill  . acetaminophen (TYLENOL) 500 MG tablet Take 500 mg by mouth every 6 (six) hours as needed for headache.      . Prenatal Vit-Fe Fumarate-FA (PRENATAL VITAMINS PLUS) 27-1 MG TABS Take 1 tablet by mouth daily.         Review of Systems  All systems reviewed and negative except as stated in HPI  Filed Vitals:   11/24/13 1532 11/24/13 1633 11/24/13 1701 11/24/13 1732  BP: 133/72 148/67 146/65    Pulse: 90 91 86   Temp:      TempSrc:      Resp: 20 20 20 20   Height:      Weight:         Blood pressure 140/80, pulse 84, temperature 98.4 F (36.9 C), temperature source Oral, resp. rate 15, height 5\' 3"  (1.6 m), weight 120.26 kg (265 lb 2 oz), last menstrual period 02/27/2013. General appearance: alert, cooperative and no distress Lungs: clear to auscultation bilaterally Heart: regular rate and rhythm Abdomen: soft, non-tender; bowel sounds normal Extremities: Homans sign is negative, no sign of DVT DTR's 2+, no clonus  Presentation: cephalic Fetal monitoringBaseline: 140 bpm, Variability: Good {> 6 bpm), Accelerations: Reactive and Decelerations: Absent Uterine activity irregular every 2-777min   Dilation: 3.5 Effacement (%): 70 Station: Ballotable Exam by:: Harvin Hazel. Millican, RN    Prenatal labs: ABO, Rh: O/Positive/-- (09/15 0000) Antibody: Negative (09/15 0000) Rubella:   RPR: NON REAC (01/07 1340)  HBsAg: Negative (09/15 0000)  HIV: NON REACTIVE (01/07 1340)  GBS: Negative (03/05 0000)  1 hr Glucola 111 Genetic screening  Normal quad Anatomy US normal    Assessment: Edwyna ShellYawa Coppin is a 38 y.o. L2G4010G4P2103 with an IUP at [redacted]w[redacted]d presenting  for contractions. Cervix in MAU changed from 2-3cm but very irregular contractions. However, favorable cervix and now with elevated BP. Will admit and augment for a diagnosis of gHTN.   Plan: 1) admit to L&D - routine orders  - epidural prn  2) early labor, hx of LTCS and desires TOLAC - will augment with pitocin due to elevated BP and hx - cont to monitor closely   3) elevated BP - PIH labs sent - no signs/symptoms - cont to monitor  4) FWB - cat I tracing - GBS neg - EFW 7.5lb  5) anticipate SVD    Tailor Lucking L, MD 11/24/2013, 2:05 PM

## 2013-11-24 NOTE — Progress Notes (Signed)
Deborah Huang is a 38 y.o. G6Y4034G4P2103 at 9049w4d admitted for early labor and found to have elevated BP.   Subjective:  Doing well. Comfortable.  No ha, vison changes, LEE. +FM. Pit @6mu /min   Objective: BP 146/70  Pulse 93  Temp(Src) 98.3 F (36.8 C) (Oral)  Resp 20  Ht 5\' 3"  (1.6 m)  Wt 120.26 kg (265 lb 2 oz)  BMI 46.98 kg/m2  LMP 02/27/2013      FHT:  FHR: 145 bpm, variability: moderate,  accelerations:  Present,  decelerations:  Absent UC:   irregular, every 1-5 minutes  SVE:   Dilation: 3 Effacement (%): 70 Station: -3 Exam by:: Deborah Huang  Labs: Lab Results  Component Value Date   WBC 6.3 11/24/2013   HGB 11.8* 11/24/2013   HCT 34.4* 11/24/2013   MCV 86.9 11/24/2013   PLT 204 11/24/2013    Assessment / Plan: IOL for gHTN  Labor: on pitocin at 806mu/min. cont to increase gHTN:  no severe range pressures. prot/cr ratio of 0.16 Fetal Wellbeing:  Category I Pain Control:  Labor support without medications I/D:  neg GBS Anticipated MOD:  NSVD  Eura Mccauslin L 11/24/2013, 6:37 PM

## 2013-11-25 ENCOUNTER — Encounter (HOSPITAL_COMMUNITY): Payer: Self-pay | Admitting: *Deleted

## 2013-11-25 ENCOUNTER — Encounter (HOSPITAL_COMMUNITY)
Admission: AD | Disposition: A | Discharge status: Home / Self Care | Payer: Self-pay | Source: Ambulatory Visit | Attending: Obstetrics & Gynecology

## 2013-11-25 DIAGNOSIS — O9902 Anemia complicating childbirth: Secondary | ICD-10-CM | POA: Diagnosis present

## 2013-11-25 DIAGNOSIS — Z302 Encounter for sterilization: Secondary | ICD-10-CM

## 2013-11-25 DIAGNOSIS — E669 Obesity, unspecified: Secondary | ICD-10-CM | POA: Diagnosis present

## 2013-11-25 DIAGNOSIS — O99214 Obesity complicating childbirth: Secondary | ICD-10-CM

## 2013-11-25 DIAGNOSIS — O34219 Maternal care for unspecified type scar from previous cesarean delivery: Secondary | ICD-10-CM | POA: Diagnosis present

## 2013-11-25 DIAGNOSIS — D573 Sickle-cell trait: Secondary | ICD-10-CM | POA: Diagnosis present

## 2013-11-25 DIAGNOSIS — O139 Gestational [pregnancy-induced] hypertension without significant proteinuria, unspecified trimester: Secondary | ICD-10-CM | POA: Diagnosis present

## 2013-11-25 DIAGNOSIS — O4290 Premature rupture of membranes, unspecified as to length of time between rupture and onset of labor, unspecified weeks of gestation: Secondary | ICD-10-CM | POA: Diagnosis present

## 2013-11-25 LAB — CBC
HEMATOCRIT: 30 % — AB (ref 36.0–46.0)
Hemoglobin: 10.3 g/dL — ABNORMAL LOW (ref 12.0–15.0)
MCH: 29.8 pg (ref 26.0–34.0)
MCHC: 34.3 g/dL (ref 30.0–36.0)
MCV: 86.7 fL (ref 78.0–100.0)
Platelets: 164 10*3/uL (ref 150–400)
RBC: 3.46 MIL/uL — ABNORMAL LOW (ref 3.87–5.11)
RDW: 15.1 % (ref 11.5–15.5)
WBC: 11.3 10*3/uL — ABNORMAL HIGH (ref 4.0–10.5)

## 2013-11-25 SURGERY — Surgical Case
Anesthesia: Epidural

## 2013-11-25 MED ORDER — SIMETHICONE 80 MG PO CHEW
80.0000 mg | CHEWABLE_TABLET | Freq: Three times a day (TID) | ORAL | Status: DC
  Administered 2013-11-25 – 2013-11-27 (×7): 80 mg via ORAL
  Filled 2013-11-25 (×8): qty 1

## 2013-11-25 MED ORDER — DIBUCAINE 1 % RE OINT: 1.0000 "application " | TOPICAL_OINTMENT | RECTAL | Status: DC | PRN

## 2013-11-25 MED ORDER — FENTANYL CITRATE 0.05 MG/ML IJ SOLN
INTRAMUSCULAR | Status: DC | PRN
  Administered 2013-11-25 (×4): 25 ug via INTRAVENOUS

## 2013-11-25 MED ORDER — FENTANYL CITRATE 0.05 MG/ML IJ SOLN
INTRAMUSCULAR | Status: DC | PRN
  Administered 2013-11-25: 100 ug via INTRAVENOUS

## 2013-11-25 MED ORDER — KETOROLAC TROMETHAMINE 30 MG/ML IJ SOLN
INTRAMUSCULAR | Status: AC
  Filled 2013-11-25: qty 1

## 2013-11-25 MED ORDER — TETANUS-DIPHTH-ACELL PERTUSSIS 5-2.5-18.5 LF-MCG/0.5 IM SUSP: 0.5000 mL | Freq: Once | INTRAMUSCULAR | Status: DC

## 2013-11-25 MED ORDER — OXYTOCIN 10 UNIT/ML IJ SOLN
INTRAMUSCULAR | Status: AC
  Filled 2013-11-25: qty 4

## 2013-11-25 MED ORDER — OXYTOCIN 10 UNIT/ML IJ SOLN
40.0000 [IU] | INTRAVENOUS | Status: DC | PRN
  Administered 2013-11-25: 40 [IU] via INTRAVENOUS

## 2013-11-25 MED ORDER — NALOXONE HCL 1 MG/ML IJ SOLN
1.0000 ug/kg/h | INTRAVENOUS | Status: DC | PRN
  Filled 2013-11-25: qty 2

## 2013-11-25 MED ORDER — KETOROLAC TROMETHAMINE 30 MG/ML IJ SOLN
30.0000 mg | Freq: Four times a day (QID) | INTRAMUSCULAR | Status: AC | PRN
  Administered 2013-11-25: 30 mg via INTRAVENOUS

## 2013-11-25 MED ORDER — KETOROLAC TROMETHAMINE 60 MG/2ML IM SOLN
60.0000 mg | Freq: Once | INTRAMUSCULAR | Status: AC | PRN
  Filled 2013-11-25: qty 2

## 2013-11-25 MED ORDER — MEPERIDINE HCL 25 MG/ML IJ SOLN
INTRAMUSCULAR | Status: DC | PRN
  Administered 2013-11-25 (×2): 12.5 mg via INTRAVENOUS

## 2013-11-25 MED ORDER — PRENATAL MULTIVITAMIN CH
1.0000 | ORAL_TABLET | Freq: Every day | ORAL | Status: DC
  Administered 2013-11-25 – 2013-11-27 (×3): 1 via ORAL
  Filled 2013-11-25 (×3): qty 1

## 2013-11-25 MED ORDER — OXYCODONE-ACETAMINOPHEN 5-325 MG PO TABS
1.0000 | ORAL_TABLET | ORAL | Status: DC | PRN
  Administered 2013-11-25 – 2013-11-26 (×2): 2 via ORAL
  Administered 2013-11-26 (×2): 1 via ORAL
  Administered 2013-11-26: 2 via ORAL
  Administered 2013-11-26: 1 via ORAL
  Administered 2013-11-27 (×3): 2 via ORAL
  Filled 2013-11-25 (×3): qty 1
  Filled 2013-11-25 (×6): qty 2

## 2013-11-25 MED ORDER — LACTATED RINGERS IV BOLUS (SEPSIS)
1000.0000 mL | Freq: Once | INTRAVENOUS | Status: AC
  Administered 2013-11-25: 1000 mL via INTRAVENOUS

## 2013-11-25 MED ORDER — FENTANYL CITRATE 0.05 MG/ML IJ SOLN: 25.0000 ug | INTRAMUSCULAR | Status: DC | PRN

## 2013-11-25 MED ORDER — MENTHOL 3 MG MT LOZG: 1.0000 | LOZENGE | OROMUCOSAL | Status: DC | PRN

## 2013-11-25 MED ORDER — SCOPOLAMINE 1 MG/3DAYS TD PT72
1.0000 | MEDICATED_PATCH | Freq: Once | TRANSDERMAL | Status: DC
  Administered 2013-11-25: 1.5 mg via TRANSDERMAL

## 2013-11-25 MED ORDER — NALBUPHINE HCL 10 MG/ML IJ SOLN: 5.0000 mg | INTRAMUSCULAR | Status: DC | PRN

## 2013-11-25 MED ORDER — LANOLIN HYDROUS EX OINT: 1.0000 "application " | TOPICAL_OINTMENT | CUTANEOUS | Status: DC | PRN

## 2013-11-25 MED ORDER — ONDANSETRON HCL 4 MG/2ML IJ SOLN: 4.0000 mg | Freq: Three times a day (TID) | INTRAMUSCULAR | Status: DC | PRN

## 2013-11-25 MED ORDER — PHENYLEPHRINE 8 MG IN D5W 100 ML (0.08MG/ML) PREMIX OPTIME
INJECTION | INTRAVENOUS | Status: AC
  Filled 2013-11-25: qty 100

## 2013-11-25 MED ORDER — ONDANSETRON HCL 4 MG PO TABS: 4.0000 mg | ORAL_TABLET | ORAL | Status: DC | PRN

## 2013-11-25 MED ORDER — LACTATED RINGERS IV SOLN
INTRAVENOUS | Status: DC | PRN
  Administered 2013-11-25: 01:00:00 via INTRAVENOUS

## 2013-11-25 MED ORDER — MEPERIDINE HCL 25 MG/ML IJ SOLN: 6.2500 mg | INTRAMUSCULAR | Status: DC | PRN

## 2013-11-25 MED ORDER — ONDANSETRON HCL 4 MG/2ML IJ SOLN
INTRAMUSCULAR | Status: AC
  Filled 2013-11-25: qty 2

## 2013-11-25 MED ORDER — ZOLPIDEM TARTRATE 5 MG PO TABS: 5.0000 mg | ORAL_TABLET | Freq: Every evening | ORAL | Status: DC | PRN

## 2013-11-25 MED ORDER — MORPHINE SULFATE 0.5 MG/ML IJ SOLN
INTRAMUSCULAR | Status: AC
  Filled 2013-11-25: qty 10

## 2013-11-25 MED ORDER — OXYTOCIN 40 UNITS IN LACTATED RINGERS INFUSION - SIMPLE MED: 62.5000 mL/h | INTRAVENOUS | Status: AC

## 2013-11-25 MED ORDER — LACTATED RINGERS IV SOLN
INTRAVENOUS | Status: DC | PRN
  Administered 2013-11-25: via INTRAVENOUS

## 2013-11-25 MED ORDER — MORPHINE SULFATE (PF) 0.5 MG/ML IJ SOLN
INTRAMUSCULAR | Status: DC | PRN
  Administered 2013-11-25: 1 mg via EPIDURAL

## 2013-11-25 MED ORDER — FENTANYL CITRATE 0.05 MG/ML IJ SOLN
INTRAMUSCULAR | Status: AC
  Filled 2013-11-25: qty 2

## 2013-11-25 MED ORDER — SIMETHICONE 80 MG PO CHEW
80.0000 mg | CHEWABLE_TABLET | ORAL | Status: DC
  Administered 2013-11-26 (×2): 80 mg via ORAL
  Filled 2013-11-25 (×2): qty 1

## 2013-11-25 MED ORDER — KETOROLAC TROMETHAMINE 30 MG/ML IJ SOLN: 30.0000 mg | Freq: Four times a day (QID) | INTRAMUSCULAR | Status: AC | PRN

## 2013-11-25 MED ORDER — DIPHENHYDRAMINE HCL 50 MG/ML IJ SOLN
25.0000 mg | INTRAMUSCULAR | Status: DC | PRN
Start: 2013-11-25 — End: 2013-11-27

## 2013-11-25 MED ORDER — ONDANSETRON HCL 4 MG/2ML IJ SOLN
INTRAMUSCULAR | Status: DC | PRN
  Administered 2013-11-25: 4 mg via INTRAVENOUS

## 2013-11-25 MED ORDER — LACTATED RINGERS IV SOLN
INTRAVENOUS | Status: DC
  Administered 2013-11-25: 13:00:00 via INTRAVENOUS

## 2013-11-25 MED ORDER — METOCLOPRAMIDE HCL 5 MG/ML IJ SOLN: 10.0000 mg | Freq: Once | INTRAMUSCULAR | Status: DC | PRN

## 2013-11-25 MED ORDER — NALOXONE HCL 0.4 MG/ML IJ SOLN: 0.4000 mg | INTRAMUSCULAR | Status: DC | PRN

## 2013-11-25 MED ORDER — SODIUM BICARBONATE 8.4 % IV SOLN
INTRAVENOUS | Status: DC | PRN
  Administered 2013-11-25 (×4): 5 mL via EPIDURAL

## 2013-11-25 MED ORDER — SIMETHICONE 80 MG PO CHEW
80.0000 mg | CHEWABLE_TABLET | ORAL | Status: DC | PRN
  Administered 2013-11-26: 80 mg via ORAL

## 2013-11-25 MED ORDER — WITCH HAZEL-GLYCERIN EX PADS: 1.0000 "application " | MEDICATED_PAD | CUTANEOUS | Status: DC | PRN

## 2013-11-25 MED ORDER — MORPHINE SULFATE (PF) 0.5 MG/ML IJ SOLN
INTRAMUSCULAR | Status: DC | PRN
  Administered 2013-11-25: 4 mg via EPIDURAL

## 2013-11-25 MED ORDER — ONDANSETRON HCL 4 MG/2ML IJ SOLN: 4.0000 mg | INTRAMUSCULAR | Status: DC | PRN

## 2013-11-25 MED ORDER — DIPHENHYDRAMINE HCL 50 MG/ML IJ SOLN: 12.5000 mg | INTRAMUSCULAR | Status: DC | PRN

## 2013-11-25 MED ORDER — CEFAZOLIN SODIUM 1-5 GM-% IV SOLN
1.0000 g | Freq: Once | INTRAVENOUS | Status: DC
  Filled 2013-11-25: qty 50

## 2013-11-25 MED ORDER — CEFAZOLIN SODIUM-DEXTROSE 2-3 GM-% IV SOLR
INTRAVENOUS | Status: DC | PRN
  Administered 2013-11-25: 2 g via INTRAVENOUS
  Administered 2013-11-25: 1 g via INTRAVENOUS

## 2013-11-25 MED ORDER — DIPHENHYDRAMINE HCL 25 MG PO CAPS: 25.0000 mg | ORAL_CAPSULE | ORAL | Status: DC | PRN

## 2013-11-25 MED ORDER — SENNOSIDES-DOCUSATE SODIUM 8.6-50 MG PO TABS
2.0000 | ORAL_TABLET | ORAL | Status: DC
  Administered 2013-11-26 (×2): 2 via ORAL
  Filled 2013-11-25 (×2): qty 2

## 2013-11-25 MED ORDER — METOCLOPRAMIDE HCL 5 MG/ML IJ SOLN: 10.0000 mg | Freq: Three times a day (TID) | INTRAMUSCULAR | Status: DC | PRN

## 2013-11-25 MED ORDER — BUPIVACAINE HCL (PF) 0.5 % IJ SOLN
INTRAMUSCULAR | Status: DC | PRN
  Administered 2013-11-25: 20 mL

## 2013-11-25 MED ORDER — SODIUM CHLORIDE 0.9 % IJ SOLN
3.0000 mL | INTRAMUSCULAR | Status: DC | PRN
  Administered 2013-11-25: 3 mL via INTRAVENOUS

## 2013-11-25 MED ORDER — PHENYLEPHRINE 8 MG IN D5W 100 ML (0.08MG/ML) PREMIX OPTIME
INJECTION | INTRAVENOUS | Status: DC | PRN
  Administered 2013-11-25: 60 ug/min via INTRAVENOUS

## 2013-11-25 MED ORDER — IBUPROFEN 600 MG PO TABS
600.0000 mg | ORAL_TABLET | Freq: Four times a day (QID) | ORAL | Status: DC
  Administered 2013-11-25 – 2013-11-27 (×10): 600 mg via ORAL
  Filled 2013-11-25 (×10): qty 1

## 2013-11-25 MED ORDER — SCOPOLAMINE 1 MG/3DAYS TD PT72
MEDICATED_PATCH | TRANSDERMAL | Status: AC
  Administered 2013-11-25: 1.5 mg via TRANSDERMAL
  Filled 2013-11-25: qty 1

## 2013-11-25 MED ORDER — DIPHENHYDRAMINE HCL 25 MG PO CAPS: 25.0000 mg | ORAL_CAPSULE | Freq: Four times a day (QID) | ORAL | Status: DC | PRN

## 2013-11-25 MED ORDER — TERBUTALINE SULFATE 1 MG/ML IJ SOLN
INTRAMUSCULAR | Status: AC
  Filled 2013-11-25: qty 1

## 2013-11-25 SURGICAL SUPPLY — 38 items
BARRIER ADHS 3X4 INTERCEED (GAUZE/BANDAGES/DRESSINGS) IMPLANT
CLAMP CORD UMBIL (MISCELLANEOUS) IMPLANT
CLIP FILSHIE TUBAL LIGA STRL (Clip) ×3 IMPLANT
CLOTH BEACON ORANGE TIMEOUT ST (SAFETY) ×3 IMPLANT
CONTAINER PREFILL 10% NBF 15ML (MISCELLANEOUS) IMPLANT
DRAPE LG THREE QUARTER DISP (DRAPES) IMPLANT
DRSG OPSITE POSTOP 4X10 (GAUZE/BANDAGES/DRESSINGS) ×3 IMPLANT
DURAPREP 26ML APPLICATOR (WOUND CARE) ×3 IMPLANT
ELECT REM PT RETURN 9FT ADLT (ELECTROSURGICAL) ×3
ELECTRODE REM PT RTRN 9FT ADLT (ELECTROSURGICAL) ×1 IMPLANT
EXTRACTOR VACUUM KIWI (MISCELLANEOUS) IMPLANT
GAUZE SPONGE 4X4 12PLY STRL LF (GAUZE/BANDAGES/DRESSINGS) ×3 IMPLANT
GLOVE BIO SURGEON STRL SZ 6.5 (GLOVE) ×2 IMPLANT
GLOVE BIO SURGEONS STRL SZ 6.5 (GLOVE) ×1
GOWN STRL REUS W/TWL LRG LVL3 (GOWN DISPOSABLE) ×6 IMPLANT
KIT ABG SYR 3ML LUER SLIP (SYRINGE) ×3 IMPLANT
NEEDLE HYPO 25X5/8 SAFETYGLIDE (NEEDLE) ×3 IMPLANT
NEEDLE SPNL 18GX3.5 QUINCKE PK (NEEDLE) ×3 IMPLANT
NS IRRIG 1000ML POUR BTL (IV SOLUTION) ×3 IMPLANT
PACK C SECTION WH (CUSTOM PROCEDURE TRAY) ×3 IMPLANT
PAD ABD 7.5X8 STRL (GAUZE/BANDAGES/DRESSINGS) ×3 IMPLANT
PAD OB MATERNITY 4.3X12.25 (PERSONAL CARE ITEMS) ×3 IMPLANT
SUT PDS AB 0 CTX 60 (SUTURE) ×3 IMPLANT
SUT VIC AB 0 CT1 27 (SUTURE)
SUT VIC AB 0 CT1 27XBRD ANBCTR (SUTURE) IMPLANT
SUT VIC AB 0 CT1 36 (SUTURE) IMPLANT
SUT VIC AB 2-0 CT1 27 (SUTURE) ×2
SUT VIC AB 2-0 CT1 TAPERPNT 27 (SUTURE) ×1 IMPLANT
SUT VIC AB 2-0 CTX 36 (SUTURE) ×6 IMPLANT
SUT VIC AB 3-0 CT1 27 (SUTURE) ×2
SUT VIC AB 3-0 CT1 TAPERPNT 27 (SUTURE) ×1 IMPLANT
SUT VIC AB 3-0 SH 27 (SUTURE)
SUT VIC AB 3-0 SH 27X BRD (SUTURE) IMPLANT
SYR 30ML LL (SYRINGE) ×3 IMPLANT
TAPE CLOTH SURG 4X10 WHT LF (GAUZE/BANDAGES/DRESSINGS) ×3 IMPLANT
TOWEL OR 17X24 6PK STRL BLUE (TOWEL DISPOSABLE) ×3 IMPLANT
TRAY FOLEY CATH 14FR (SET/KITS/TRAYS/PACK) IMPLANT
WATER STERILE IRR 1000ML POUR (IV SOLUTION) ×3 IMPLANT

## 2013-11-25 NOTE — Addendum Note (Signed)
Addendum created 11/25/13 1008 by Turner DanielsJennifer L Sela Falk, CRNA   Modules edited: Notes Section   Notes Section:  File: [id number]

## 2013-11-25 NOTE — OR Nursing (Signed)
Clean Contaminated Chosen as Labor and Delivery Nurse needed to push Vaginally. Consents obtained in the Operating Room Prior to incision. Spouse agreed with patient for the desire for sterilization.

## 2013-11-25 NOTE — Op Note (Signed)
11/24/2013 - 11/25/2013  1:09 AM  PATIENT:  Deborah Huang  38 y.o. female  PRE-OPERATIVE DIAGNOSIS:  TOLAC, fetal bradycardia, desire for sterility  POST-OPERATIVE DIAGNOSIS:  Ruptured uterus, hemoperitoneum, and desire for sterility  PROCEDURE:  Procedure(s): CESAREAN SECTION (N/A) And STERILIZATION PROCEDURE (Filsche clips)  FINDINGS: Living female infant with Apgars of 8 & 9, cord gas above, intact placenta, rupture of entire previous hysterotomy incision, baby was in the abdominal cavity  SURGEON:  Surgeon(s) and Role:    * Allie BossierMyra C Ronon Ferger, MD - Primary  PHYSICIAN ASSISTANT:   ASSISTANTS: none   ANESTHESIA:   epidural  EBL:  Total I/O In: -  Out: 1050 [Urine:450; Blood:600]  BLOOD ADMINISTERED:none  DRAINS: none   LOCAL MEDICATIONS USED:  MARCAINE     SPECIMEN:  Source of Specimen:  cord gas 7.01  DISPOSITION OF SPECIMEN:  PATHOLOGY  COUNTS:  YES  TOURNIQUET:  * No tourniquets in log *  DICTATION: .Dragon Dictation  PLAN OF CARE: Admit to inpatient   PATIENT DISPOSITION:  PACU - hemodynamically stable.   Delay start of Pharmacological VTE agent (>24hrs) due to surgical blood loss or risk of bleeding: not applicable  The risks, benefits, and alternatives of surgery were explained, understood, accepted. Consents were signed. All questions were answered. She and her husband both want for her to have a sterilization procedure. They understand that this is permanent. Her abdomen was prepped and draped in the usual sterile fashion. A Foley catheter had been placed during labor, draining clear urine throughout case. Timeout procedure was done. After adequate anesthesia was assured, an incision was made through the previous incision. The incision was carried down through the subcutaneous tissue to the fascia. The fascia was scored the midline and extended bilaterally. The middle 50% of the rectus muscles were separated in a transverse fashion using electrosurgical technique.  Excellent hemostasis was maintained. The peritoneum was entered with hemostats. A large amount of blood was noted to be in the peritoneal cavity, as was the baby. Peritoneal incision was extended with the Bovie. The baby was delivered from a vertex presentation with the help of a nurse pushing upwards from the vagina. The mouth and nostrils were suctioned after the delivery of the baby.  The baby's cord was clamped and cut and was transferred to the NICU personnel for care. The placenta was delivered intact with traction. The uterus was left in situ and the interior was cleaned with a dry lap sponge. The uterine incision was closed with 2-0 Vicryl running locking suture. Excellent hemostasis was noted. By tilting the uterus each side was able to visualize the adnexa, and they were normal. I placed a Filsche clip on each tube in the isthmic region. The rectus fascia rectus muscles were noted be hemostatic as well. The fascia was closed with a #1 PDS loop in a running nonlocking fashion. No defects were palpable. The subcutaneous tissue was irrigated, clean, and dried. I then injected 30 cc of 0.5% marcaine into the subq tissue.  A subcuticular closure was done with a 3-0 Vicryl suture. Steri-Strips are placed. Excellent cosmetic results were obtained. She was taken to the recovery room in stable condition. She tolerated the procedure well.

## 2013-11-25 NOTE — Progress Notes (Signed)
Patient ID: Deborah Huang, female   DOB: Jun 02, 1976, 38 y.o.   MRN: [id number] Called to check patient due to sudden onset of lower abdominal pain, despite previous comfort after epidural. Pt states "pain is too much"  Has had intermittent variable decels prior to this with good variability Now has developed persistent variable decels/bradycardia to 85-90.  Terbutaline ordered to slow UCs Cervix checked, noted to be 9cm, almost reduceable, but unable to maintain  Tried to have her push, but she could not  Dr Marice Potterove called and arrived in room  Decision made to proceed with C/S

## 2013-11-25 NOTE — Progress Notes (Signed)
[id number]  Husband called out, patient C/O severe abdominal pain and tenderness. Crying in pain. SVE, rim, called for Wynelle BourgeoisMarie Williams CNM to evaluate at bedside.

## 2013-11-25 NOTE — Anesthesia Postprocedure Evaluation (Signed)
  Anesthesia Post-op Note  Anesthesia Post Note  Patient: Deborah Huang  Procedure(s) Performed: Procedure(s) (LRB): CESAREAN SECTION (N/A)  Anesthesia type: Epidural  Patient location: PACU  Post pain: Pain level controlled  Post assessment: Post-op Vital signs reviewed  Last Vitals:  Filed Vitals:   11/25/13 0215  BP: 112/40  Pulse: 89  Temp:   Resp: 16    Post vital signs: stable  Level of consciousness: awake  Complications: No apparent anesthesia complications

## 2013-11-25 NOTE — Transfer of Care (Signed)
Immediate Anesthesia Transfer of Care Note  Patient: Deborah Huang  Procedure(s) Performed: Procedure(s): CESAREAN SECTION (N/A)  Patient Location: PACU  Anesthesia Type:Epidural  Level of Consciousness: awake, alert  and oriented  Airway & Oxygen Therapy: Patient Spontanous Breathing and Patient connected to nasal cannula oxygen  Post-op Assessment: Report given to PACU RN and Post -op Vital signs reviewed and stable  Post vital signs: Reviewed and stable  Complications: No apparent anesthesia complications

## 2013-11-25 NOTE — Anesthesia Postprocedure Evaluation (Signed)
  Anesthesia Post-op Note  Anesthesia Post Note  Patient: Deborah Huang  Procedure(s) Performed: Procedure(s) (LRB): CESAREAN SECTION (N/A)  Anesthesia type: Epidural  Patient location: Mother/Baby  Post pain: Pain level controlled  Post assessment: Post-op Vital signs reviewed  Last Vitals:  Filed Vitals:   11/25/13 0910  BP: 133/74  Pulse: 94  Temp:   Resp:     Post vital signs: Reviewed  Level of consciousness:alert  Complications: No apparent anesthesia complications

## 2013-11-25 NOTE — Progress Notes (Deleted)
Stat C-Section, Labor and Delivery Nurse Pushing From Vagina

## 2013-11-26 LAB — CBC
HCT: 29.9 % — ABNORMAL LOW (ref 36.0–46.0)
Hemoglobin: 10.1 g/dL — ABNORMAL LOW (ref 12.0–15.0)
MCH: 29.1 pg (ref 26.0–34.0)
MCHC: 33.8 g/dL (ref 30.0–36.0)
MCV: 86.2 fL (ref 78.0–100.0)
Platelets: 182 10*3/uL (ref 150–400)
RBC: 3.47 MIL/uL — ABNORMAL LOW (ref 3.87–5.11)
RDW: 15.2 % (ref 11.5–15.5)
WBC: 7.9 10*3/uL (ref 4.0–10.5)

## 2013-11-26 NOTE — Progress Notes (Signed)
Subjective: Postpartum Day 1: Cesarean Delivery and BTL after failed TOLAC and uterine rupture Patient reports tolerating PO, + flatus and no problems voiding.    Objective: Vital signs in last 24 hours: Temp:  [98 F (36.7 C)-98.9 F (37.2 C)] 98.3 F (36.8 C) (03/23 0532) Pulse Rate:  [78-102] 78 (03/23 0532) Resp:  [17-20] 17 (03/23 0532) BP: (111-138)/(68-84) 137/80 mmHg (03/23 0532) SpO2:  [96 %-99 %] 98 % (03/22 2100)  Physical Exam:  General: alert, cooperative and no distress Lochia: appropriate Uterine Fundus: firm Incision: healing well, no significant drainage, no dehiscence, no significant erythema DVT Evaluation: No evidence of DVT seen on physical exam. Negative Homan's sign. No cords or calf tenderness. 1+ LE edema b/l  Recent Labs  11/25/13 0125 11/26/13 0708  HGB 10.3* 10.1*  HCT 30.0* 29.9*    Assessment/Plan: Status post Cesarean section. Doing well postoperatively. Breastfeeding and ambulating   Continue current care.  Wenda LowJoyner, James 11/26/2013, 7:43 AM  I have seen and examined this patient and agree with above documentation in the resident's note.   Rulon AbideKeli Elynn Patteson, M.D. [facility] 11/26/2013 7:55 AM

## 2013-11-27 ENCOUNTER — Encounter (HOSPITAL_COMMUNITY): Payer: Self-pay | Admitting: Obstetrics & Gynecology

## 2013-11-27 MED ORDER — IBUPROFEN 600 MG PO TABS: 600.0000 mg | ORAL_TABLET | Freq: Four times a day (QID) | ORAL | Status: DC | PRN

## 2013-11-27 MED ORDER — OXYCODONE-ACETAMINOPHEN 5-325 MG PO TABS: 1.0000 | ORAL_TABLET | ORAL | Status: DC | PRN

## 2013-11-27 NOTE — Discharge Instructions (Signed)
Remove your dressing 5 days after the surgery

## 2013-11-27 NOTE — Progress Notes (Signed)
Subjective: Postpartum Day 2: Cesarean Delivery Patient reports tolerating PO, + flatus, + BM and no problems voiding.  Pt plans on bottle/breastfeeding.  Objective: Vital signs in last 24 hours: Temp:  [97.4 F (36.3 C)-98.7 F (37.1 C)] 97.4 F (36.3 C) (03/24 0625) Pulse Rate:  [85-91] 85 (03/24 0625) Resp:  [18-20] 18 (03/24 0625) BP: (136-159)/(67-85) 159/85 mmHg (03/24 0625) SpO2:  [96 %-99 %] 96 % (03/24 [id number])  Physical Exam:  General: alert and cooperative Lochia: appropriate Uterine Fundus: firm Incision: healing well, no significant drainage, no dehiscence, no significant erythema DVT Evaluation: No evidence of DVT seen on physical exam.   Recent Labs  11/25/13 0125 11/26/13 0708  HGB 10.3* 10.1*  HCT 30.0* 29.9*    Assessment/Plan: Status post Cesarean section. Doing well postoperatively.  Continue current care.  Unknown FoleyWinkel, Bradley T 11/27/2013, 9:05 AM Evaluation and management procedures were performed by PA-S under my supervision/collaboration. Chart reviewed, patient examined by me and I agree with management and plan. S/P failed TOLAC, ruptured uterus

## 2013-11-27 NOTE — Discharge Summary (Signed)
Obstetric Discharge Summary Reason for Admission: onset of labor GHTN, AMA, TOLAC, desires sterility Prenatal Procedures: NST and ultrasound Intrapartum Procedures: cesarean: low cervical, transverse Postpartum Procedures: P.P. tubal ligation Complications-Operative and Postpartum: uterine rupture Hemoglobin  Date Value Ref Range Status  11/26/2013 10.1* 12.0 - 15.0 g/dL Final  1/61/09609/15/2014 45.412.6   Final     HCT  Date Value Ref Range Status  11/26/2013 29.9* 36.0 - 46.0 % Final  05/21/2013 38   Final  Hospital Course: 38 yo [id number] presented at 6819w1d in early labor, desirous of TOLAC and PPS. She progressed to 9 cm, then had severe abdominal pain and fetal bradycardia. Terbutaline given and decision made to proceed with C/S. At surgery hemoperitoneum and uterine rupture noted. BTL was done.  Did well postpartum.  Physical Exam:  Filed Vitals:   11/26/13 0532 11/26/13 1100 11/26/13 1745 11/27/13 0625  BP: 137/80 136/67 138/85 159/85  Pulse: 78 91 88 85  Temp: 98.3 F (36.8 C) 98.5 F (36.9 C) 98.7 F (37.1 C) 97.4 F (36.3 C)  TempSrc: Oral Oral Oral Oral  Resp: 17 18 20 18   Height:      Weight:      SpO2:  99%  96%   General: alert, cooperative, no distress and moderately obese Ambulatory with no orthostatic sx. Lochia: appropriate Uterine Fundus: firm at U-2 fb Incision: no significant drainage, beehive dressing C/D/I DVT Evaluation: No evidence of DVT seen on physical exam.  Discharge Diagnoses: Term Pregnancy-delivered, uterine rupture, RLTCS/BTL. Doing well on POD #2 1/2. Isolated BP elevation> will repeat pre-discharge  Discharge Information: Date: 11/27/2013 Activity: pelvic rest Diet: per booklet Medications: PNV, Ibuprofen and Percocet Condition: stable Instructions: refer to practice specific booklet Discharge to: rooming in status Follow-up Information   Follow up with [facility] On 12/21/2013. (Someone from Clinic will call you with appt.)    Contact  information:   [address] [place] [address] [phone]       Newborn Data: Live born female  Birth Weight: 7 lb 10.8 oz (3480 g) APGAR: 8, 9  Baby under bili lights - for discharge in am.  Deborah Huang 11/27/2013, 5:33 PM

## 2013-11-27 NOTE — Progress Notes (Signed)
UR chart review completed.  

## 2013-11-28 ENCOUNTER — Encounter: Payer: Self-pay | Admitting: *Deleted

## 2013-11-28 ENCOUNTER — Ambulatory Visit: Payer: Self-pay

## 2013-11-28 NOTE — Lactation Note (Signed)
This note was copied from the chart of Girl Tonnette Pinnix. Lactation Consultation Note Follow up consult:  Mother placed baby in cradle position at breast in chair.  Lips flanged, sucks observed. Provided support pillows for mother's comfort.  Baby unlatched and mother has good flow of breastmilk. Reviewed hand expression, hand pump use, engorgement care. Encouraged mother to call if further assistance is needed.  Patient Name: Girl Edwyna ShellYawa Buck ZOXWR'UToday's Date: 11/28/2013 Reason for consult: Follow-up assessment   Maternal Data    Feeding Feeding Type: Breast Fed Length of feed: 45 min  LATCH Score/Interventions Latch: Grasps breast easily, tongue down, lips flanged, rhythmical sucking. Intervention(s): Breast massage  Audible Swallowing: A few with stimulation  Type of Nipple: Everted at rest and after stimulation  Comfort (Breast/Nipple): Filling, red/small blisters or bruises, mild/mod discomfort  Problem noted: Mild/Moderate discomfort Interventions (Mild/moderate discomfort): Hand massage  Hold (Positioning): No assistance needed to correctly position infant at breast.  LATCH Score: 8  Lactation Tools Discussed/Used     Consult Status Consult Status: PRN    Hardie PulleyBerkelhammer, Ruth Boschen 11/28/2013, 9:49 AM

## 2013-12-05 ENCOUNTER — Encounter: Payer: Self-pay | Admitting: Advanced Practice Midwife

## 2013-12-26 ENCOUNTER — Ambulatory Visit (INDEPENDENT_AMBULATORY_CARE_PROVIDER_SITE_OTHER): Payer: Medicaid Other | Admitting: Obstetrics & Gynecology

## 2013-12-26 ENCOUNTER — Encounter: Payer: Self-pay | Admitting: Obstetrics & Gynecology

## 2013-12-26 VITALS — BP 124/76 | HR 80 | Temp 98.6°F | Ht 63.0 in | Wt 242.2 lb

## 2013-12-26 DIAGNOSIS — Z09 Encounter for follow-up examination after completed treatment for conditions other than malignant neoplasm: Secondary | ICD-10-CM

## 2013-12-26 NOTE — Progress Notes (Signed)
   Subjective:    Patient ID: Deborah Huang, female    DOB: 1976-09-05, 38 y.o.   MRN: [id number]  HPI  This 38 yo lady is here for her 6 week PP/PO visit. She had a RLTCS after a failed TOLAC. Her uterus was ruptured. She denies any problems, reports normal bowel and bladder function. She has not had sex since delivery. Her daughter is doing well. I did a sterilization procedure with Filsche clips for contraception. She denies depression.  Review of Systems     Objective:   Physical Exam  Well-healed incision      Assessment & Plan:  PP/PO- doing well RTC for annual/pap in 3 months

## 2014-05-01 ENCOUNTER — Encounter: Payer: Self-pay | Admitting: General Practice

## 2014-05-15 ENCOUNTER — Encounter: Payer: Self-pay | Admitting: Obstetrics & Gynecology

## 2014-05-15 ENCOUNTER — Ambulatory Visit (INDEPENDENT_AMBULATORY_CARE_PROVIDER_SITE_OTHER): Payer: 59 | Admitting: Obstetrics & Gynecology

## 2014-05-15 VITALS — BP 135/74 | HR 71 | Temp 98.7°F | Wt 242.9 lb

## 2014-05-15 DIAGNOSIS — Z0000 Encounter for general adult medical examination without abnormal findings: Secondary | ICD-10-CM

## 2014-05-15 DIAGNOSIS — Z1151 Encounter for screening for human papillomavirus (HPV): Secondary | ICD-10-CM

## 2014-05-15 DIAGNOSIS — Z124 Encounter for screening for malignant neoplasm of cervix: Secondary | ICD-10-CM

## 2014-05-15 DIAGNOSIS — Z23 Encounter for immunization: Secondary | ICD-10-CM

## 2014-05-15 NOTE — Progress Notes (Signed)
Pt reports lower abdominal pain, scant bleeding

## 2014-05-15 NOTE — Progress Notes (Signed)
Referral to [facility] faxed. Pt aware.

## 2014-05-15 NOTE — Progress Notes (Signed)
Subjective:    Ermie Glendenning is a 38 y.o. female who presents for an annual exam. The patient has no complaints today. The patient is sexually active. GYN screening history: last pap: was normal. The patient wears seatbelts: yes. The patient participates in regular exercise: yes. Has the patient ever been transfused or tattooed?: no. The patient reports that there is not domestic violence in her life.   Menstrual History: OB History   Grav Para Term Preterm Abortions TAB SAB Ect Mult Living   Menarche age: 19  No LMP recorded. Patient is not currently having periods (Reason: Lactating).    The following portions of the patient's history were reviewed and updated as appropriate: allergies, current medications, past family history, past medical history, past social history, past surgical history and problem list.  Review of Systems A comprehensive review of systems was negative. She had a tubal ligation with her C/S. She is a married homemaker.   Objective:    BP 135/74  Pulse 71  Temp(Src) 98.7 F (37.1 C)  Wt 242 lb 14.4 oz (110.179 kg)  Breastfeeding? Yes  General Appearance:    Alert, cooperative, no distress, appears stated age  Head:    Normocephalic, without obvious abnormality, atraumatic  Eyes:    PERRL, conjunctiva/corneas clear, EOM's intact, fundi    benign, both eyes  Ears:    Normal TM's and external ear canals, both ears  Nose:   Nares normal, septum midline, mucosa normal, no drainage    or sinus tenderness  Throat:   Lips, mucosa, and tongue normal; teeth and gums normal  Neck:   Supple, symmetrical, trachea midline, no adenopathy;    thyroid:  no enlargement/tenderness/nodules; no carotid   bruit or JVD  Back:     Symmetric, no curvature, ROM normal, no CVA tenderness  Lungs:     Clear to auscultation bilaterally, respirations unlabored  Chest Wall:    No tenderness or deformity   Heart:    Regular rate and rhythm, S1 and S2 normal, no  murmur, rub   or gallop  Breast Exam:    No tenderness, masses, or nipple abnormality  Abdomen:     Soft, non-tender, bowel sounds active all four quadrants,    no masses, no organomegaly  Genitalia:    Normal female without lesion, discharge or tenderness, NSSA, No adnexal masses appreciated     Extremities:   Extremities normal, atraumatic, no cyanosis or edema  Pulses:   2+ and symmetric all extremities  Skin:   Skin color, texture, turgor normal, no rashes or lesions  Lymph nodes:   Cervical, supraclavicular, and axillary nodes normal  Neurologic:   CNII-XII intact, normal strength, sensation and reflexes    throughout  .    Assessment:    Healthy female exam.    Plan:     Breast self exam technique reviewed and patient encouraged to perform self-exam monthly. Thin prep Pap smear.  with cotesting

## 2014-05-17 LAB — CYTOLOGY - PAP

## 2014-05-31 ENCOUNTER — Encounter (HOSPITAL_COMMUNITY): Payer: Self-pay | Admitting: Emergency Medicine

## 2014-05-31 ENCOUNTER — Emergency Department (HOSPITAL_COMMUNITY)
Admission: EM | Admit: 2014-05-31 | Discharge: 2014-05-31 | Disposition: A | Discharge status: Home / Self Care | Payer: 59 | Attending: Emergency Medicine | Admitting: Emergency Medicine

## 2014-05-31 ENCOUNTER — Emergency Department (HOSPITAL_COMMUNITY): Payer: 59

## 2014-05-31 DIAGNOSIS — R42 Dizziness and giddiness: Secondary | ICD-10-CM | POA: Insufficient documentation

## 2014-05-31 DIAGNOSIS — R109 Unspecified abdominal pain: Secondary | ICD-10-CM | POA: Insufficient documentation

## 2014-05-31 DIAGNOSIS — K219 Gastro-esophageal reflux disease without esophagitis: Secondary | ICD-10-CM | POA: Diagnosis not present

## 2014-05-31 DIAGNOSIS — Z862 Personal history of diseases of the blood and blood-forming organs and certain disorders involving the immune mechanism: Secondary | ICD-10-CM | POA: Diagnosis not present

## 2014-05-31 DIAGNOSIS — M79671 Pain in right foot: Secondary | ICD-10-CM

## 2014-05-31 DIAGNOSIS — D649 Anemia, unspecified: Secondary | ICD-10-CM | POA: Diagnosis not present

## 2014-05-31 DIAGNOSIS — Z791 Long term (current) use of non-steroidal anti-inflammatories (NSAID): Secondary | ICD-10-CM | POA: Insufficient documentation

## 2014-05-31 DIAGNOSIS — M79609 Pain in unspecified limb: Secondary | ICD-10-CM | POA: Diagnosis not present

## 2014-05-31 LAB — CBC WITH DIFFERENTIAL/PLATELET
BASOS ABS: 0 10*3/uL (ref 0.0–0.1)
Basophils Relative: 0 % (ref 0–1)
EOS ABS: 0.1 10*3/uL (ref 0.0–0.7)
Eosinophils Relative: 2 % (ref 0–5)
HEMATOCRIT: 36.7 % (ref 36.0–46.0)
Hemoglobin: 12.3 g/dL (ref 12.0–15.0)
LYMPHS ABS: 2.5 10*3/uL (ref 0.7–4.0)
Lymphocytes Relative: 47 % — ABNORMAL HIGH (ref 12–46)
MCH: 27.8 pg (ref 26.0–34.0)
MCHC: 33.5 g/dL (ref 30.0–36.0)
MCV: 82.8 fL (ref 78.0–100.0)
Monocytes Absolute: 0.3 10*3/uL (ref 0.1–1.0)
Monocytes Relative: 5 % (ref 3–12)
Neutro Abs: 2.5 10*3/uL (ref 1.7–7.7)
Neutrophils Relative %: 46 % (ref 43–77)
PLATELETS: 207 10*3/uL (ref 150–400)
RBC: 4.43 MIL/uL (ref 3.87–5.11)
RDW: 13.5 % (ref 11.5–15.5)
WBC: 5.4 10*3/uL (ref 4.0–10.5)

## 2014-05-31 LAB — COMPREHENSIVE METABOLIC PANEL
ALT: 37 U/L — AB (ref 0–35)
AST: 23 U/L (ref 0–37)
Albumin: 4.4 g/dL (ref 3.5–5.2)
Alkaline Phosphatase: 45 U/L (ref 39–117)
Anion gap: 13 (ref 5–15)
BUN: 12 mg/dL (ref 6–23)
CALCIUM: 9.8 mg/dL (ref 8.4–10.5)
CO2: 26 mEq/L (ref 19–32)
Chloride: 101 mEq/L (ref 96–112)
Creatinine, Ser: 0.67 mg/dL (ref 0.50–1.10)
GFR calc non Af Amer: >90 mL/min (ref >90)
GLUCOSE: 79 mg/dL (ref 70–99)
Potassium: 3.9 mEq/L (ref 3.7–5.3)
Sodium: 140 mEq/L (ref 137–147)
TOTAL PROTEIN: 7.6 g/dL (ref 6.0–8.3)
Total Bilirubin: 0.4 mg/dL (ref 0.3–1.2)

## 2014-05-31 LAB — LIPASE, BLOOD: LIPASE: 20 U/L (ref 11–59)

## 2014-05-31 MED ORDER — OMEPRAZOLE 20 MG PO CPDR: 20.0000 mg | DELAYED_RELEASE_CAPSULE | Freq: Every day | ORAL | Status: DC
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
## 2014-05-31 NOTE — Discharge Instructions (Signed)
Start taking omeprazole  once a day for acid reflux.   Gastroesophageal Reflux Disease, Adult Gastroesophageal reflux disease (GERD) happens when acid from your stomach goes into your food pipe (esophagus). The acid can cause a burning feeling in your chest. Over time, the acid can make small holes (ulcers) in your food pipe.  HOME CARE  Ask your doctor for advice about:  Losing weight.  Quitting smoking.  Alcohol use.  Avoid foods and drinks that make your problems worse. You may want to avoid:  Caffeine and alcohol.  Chocolate.  Mints.  Garlic and onions.  Spicy foods.  Citrus fruits, such as oranges, lemons, or limes.  Foods that contain tomato, such as sauce, chili, salsa, and pizza.  Fried and fatty foods.  Avoid lying down for 3 hours before you go to bed or before you take a nap.  Eat small meals often, instead of large meals.  Wear loose-fitting clothing. Do not wear anything tight around your waist.  Raise (elevate) the head of your bed 6 to 8 inches with wood blocks. Using extra pillows does not help.  Only take medicines as told by your doctor.  Do not take aspirin or ibuprofen. GET HELP RIGHT AWAY IF:   You have pain in your arms, neck, jaw, teeth, or back.  Your pain gets worse or changes.  You feel sick to your stomach (nauseous), throw up (vomit), or sweat (diaphoresis).  You feel short of breath, or you pass out (faint).  Your throw up is green, yellow, black, or looks like coffee grounds or blood.  Your poop (stool) is red, bloody, or black. MAKE SURE YOU:   Understand these instructions.  Will watch your condition.  Will get help right away if you are not doing well or get worse. Document Released: 02/09/2008 Document Revised: 11/15/2011 Document Reviewed: 03/12/2011 [facility] Patient Information 2015 [place], [place]. This information is not intended to replace advice given to you by your health care provider. Make sure you discuss  any questions you have with your health care provider.

## 2014-05-31 NOTE — ED Notes (Signed)
To x-ray
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
## 2014-05-31 NOTE — ED Provider Notes (Signed)
Patient seen examined.   Been taking NSAIDs intermittently for foot pain. No abdominal pain. May likely be secondary to NSAID use. No biliary colic symptoms. Benign exam. Benign to exam. Reassuring labs. No fracture. Agree with proton pump inhibitor and outpatient followup.  Rolland Porter, MD 05/31/14 [phone]

## 2014-06-01 NOTE — ED Provider Notes (Signed)
I saw and evaluated the patient, reviewed the resident's note and I agree with the findings and plan.   EKG Interpretation None         Rolland Porter, MD 06/01/14 [phone]

## 2014-07-08 ENCOUNTER — Encounter (HOSPITAL_COMMUNITY): Payer: Self-pay | Admitting: Emergency Medicine

## 2014-07-30 ENCOUNTER — Encounter: Payer: Self-pay | Admitting: Obstetrics and Gynecology

## 2014-07-30 ENCOUNTER — Ambulatory Visit (INDEPENDENT_AMBULATORY_CARE_PROVIDER_SITE_OTHER): Payer: 59 | Admitting: Obstetrics and Gynecology

## 2014-07-30 VITALS — BP 159/82 | HR 71 | Temp 98.0°F | Ht 63.0 in | Wt 247.0 lb

## 2014-07-30 DIAGNOSIS — R030 Elevated blood-pressure reading, without diagnosis of hypertension: Secondary | ICD-10-CM

## 2014-07-30 DIAGNOSIS — N926 Irregular menstruation, unspecified: Secondary | ICD-10-CM

## 2014-07-30 DIAGNOSIS — IMO0001 Reserved for inherently not codable concepts without codable children: Secondary | ICD-10-CM

## 2014-07-30 DIAGNOSIS — E669 Obesity, unspecified: Secondary | ICD-10-CM

## 2014-07-30 DIAGNOSIS — E785 Hyperlipidemia, unspecified: Secondary | ICD-10-CM

## 2014-07-30 LAB — LIPID PANEL
CHOLESTEROL: 212 mg/dL — AB (ref 0–200)
HDL: 42 mg/dL (ref >39)
LDL Cholesterol: 150 mg/dL — ABNORMAL HIGH (ref 0–99)
Total CHOL/HDL Ratio: 5 Ratio
Triglycerides: 99 mg/dL (ref <150)
VLDL: 20 mg/dL (ref 0–40)

## 2014-07-30 LAB — POCT GLYCOSYLATED HEMOGLOBIN (HGB A1C): Hemoglobin A1C: 5.7

## 2014-07-30 NOTE — Progress Notes (Signed)
     Subjective: Chief Complaint  Patient presents with  . New Evaluation   In-person French interpretor used.  HPI: Deborah Huang is a 38 y.o. presenting to clinic today to discuss the following:  #Elevated BP. Patient does not monitor her own blood pressures at home. However she was told that she needed to see a doctor due to elevated pressures. She is currently not on any medications for this. Patient does endorse some dizziness that occurs when she gets nervous. Also says she has some headaches. Denies visual changes, nausea, vomiting, chest pain, abdominal pain or shortness of breath.  #Irregular Vaginal Bleeding.  Patient states that she has been having on and off bleeding that is not consistent with her normal menstrual cycle. She states that she will bleed and then may be off for a week and then re-bleed. She denies heavy bleeding or pain. Patient states that she has had her tubes tied for birth control prevention and does not think she is pregnant. She is breastfeeding and her most recent child is [id number] AiLDiamaBoris L(9[facility] Surger54mo 86 TanLoraDiamaBoris L(72[facility] Healt51mo 6DiamaBoris L9[facility] -DiamaBoris L7G63moe69 WesLoraine LeriDiamaBoris L5[facility] Cen14moe3 AmeLoraine DiamaBoris L5[facility] Car50mo 34 [address] AtLorDiamaBoris L9Viewpoint AssessDiamaBoris L2[facility] DiamaBoris L(50[facility] Whit7DiamaBoris L3[facility] [id number] UnivLorainDiamaBoris L4[facility]45mon43 W. NDiamaBoris L3[facility] 67moo7818 GlLoraine Lerich1610[place]73WashinAlbanDiamaBoris L9[facility] QBoris L442mo521DiamaBoris L9[facility] 73mooDiamaBoris L2[facility]ica5mo 168 MiddlLDiamaBoris L(2[facility] Surger89mo 491 WesLoraine Lerich1610[place]34WaDiamaBoris [facility]46mo [address]99 [address]. LaLoraine Lerich1610KentDiamaBoris L(9[facility]8mo 44 TaLoDiamaBoris L7[facility] RehabilitaDiamaBoris L8[facility] Ce62mot548 SDiamaBoris L8[facility] Ce57mot9 SE. LoDiamaBoris L(7[facility] Midlan61mo/7688 PleLoraine Lerich1610[place]68WashinGreaDiamaBoris L7[place] 5moo8355 RDiamaBoris L6[facility] 48moo29DiamaBoris DiamaBoris L(86[facility]42mot770Loraine Lerich1610[place]75WashinPresence LakDiamaBoris L9[facility] Par31mon201 W. RoLoraine Lerich1610KentDiamaBoris L9[facility] Ce61mot8146 MeadoLoraineDiamaBoris L6[facility] Medica59mo 7 DLoraine Lerich1610KeDiamaBoris AlliaDiamaBoris [facility]15mol16 S. Loraine Lerich1610[place]17WashinJacksonvDiamaBoris L2[facility] I62mos9097 DuLoraine Lerich1610[place]70WashinDiamaBoris L(7[facility] -DiamaBoris L5[facility]680ksoraine Lerich1610[place]51WashinNew York Eye And Ear InThe Endoscopy Center nless otherwise noted in the HPI Past Medical, Surgical, Social, and Family History Reviewed & Updated per EMR.   Objective: BP 159/82 mmHg  Pulse 71  Temp(Src) 98 F (36.7 C) (Oral)  Ht 5\' 3"  (1.6 m)  Wt 247 lb (112.038 kg)  BMI 43.76 kg/m2  LMP 07/21/2014  General: alert, NAD, cooperative, pleasant, obese HEENT: NCAT, EOMI, no injection and anicteric, MMM Lungs: CTAB, normal respiratory effort, no crackles, and no wheezes.  Heart: RRR, no M/R/G.  Abdomen: Bowel sounds normal; abdomen soft and nontender.  Extremities: No cyanosis, clubbing, edema Neurologic: No focal deficits Skin: Intact without suspicious lesions or rashes. Warm and dry.  Assessment/Plan: Please see problem based Assessment and Plan   [name], DO 07/30/2014, 4:04 PM PGY-1, [facility]

## 2014-07-30 NOTE — Patient Instructions (Signed)
Ms. Deborah Huang it was great to see you today!  I am pleased to hear that things are going well for you.  Here are some of the things we discussed today: -Please check your blood pressures over the next month randomly and record them for me to follow. -I will contact you about your lab results.   Please schedule a follow-up appointment for 1 month.   Thanks for allowing me to be a part of your care! Dr. Doroteo GlassmanPhelps

## 2014-07-31 ENCOUNTER — Encounter: Payer: Self-pay | Admitting: Obstetrics and Gynecology

## 2014-07-31 DIAGNOSIS — IMO0001 Reserved for inherently not codable concepts without codable children: Secondary | ICD-10-CM | POA: Insufficient documentation

## 2014-07-31 DIAGNOSIS — E669 Obesity, unspecified: Secondary | ICD-10-CM | POA: Insufficient documentation

## 2014-07-31 DIAGNOSIS — N926 Irregular menstruation, unspecified: Secondary | ICD-10-CM | POA: Insufficient documentation

## 2014-07-31 DIAGNOSIS — R030 Elevated blood-pressure reading, without diagnosis of hypertension: Secondary | ICD-10-CM

## 2014-07-31 NOTE — Assessment & Plan Note (Signed)
A/P: Likely normal varient after child birth. Will continue to monitor. Patient is breastfeeding which may also be contributing to irregular menstrual cycles. Patient recently seen by OBGYN for annual exam. During that visit no concerns mentioned of irregular bleeding. Patient had normal pelvic exam without blood or discharge. If continues will try low dose progestin pill since patient is breastfeeding still.

## 2014-07-31 NOTE — Assessment & Plan Note (Addendum)
Will check lipid panel and POCT A1c today 5.7.

## 2014-07-31 NOTE — Assessment & Plan Note (Signed)
A: BP 159/82. P: Will have patient come back to clinic for 2 nurse BP checks. She is unable to take her own pressures at home. She is to follow-up with me in one month. If pressures remain elevated will formally make diagnosis of HTN and treat but not appropriate at this time with only one recorded high. She may have elevated pressures today for other reasons.

## 2014-08-05 ENCOUNTER — Encounter: Payer: Self-pay | Admitting: *Deleted

## 2014-08-05 DIAGNOSIS — E785 Hyperlipidemia, unspecified: Secondary | ICD-10-CM | POA: Insufficient documentation

## 2014-08-05 NOTE — Progress Notes (Signed)
Quick Note:  [facility] please call pt and let her know that her labs showed that her cholesterol is elevated. She needs to help lower her risk by eating a heart-healthy diet, regular aerobic exercise, and maintaining a desirable body weight. No medication needed at this time. Patient's blood sugar was normal.   Thanks! Pincus LargeJazma Y Mitchell Epling, DO ______

## 2014-08-05 NOTE — Assessment & Plan Note (Signed)
A: Total cholesterol 212 and LDL 150.  P: Patient encouraged to eat a heart-healthy diet, regular aerobic exercise, and maintaining a desirable body weight to help lower levels. No medication needed at this time since her 6559yr ASCVD risk is 2.9%. Will reaccess in 3 years.

## 2014-08-06 ENCOUNTER — Ambulatory Visit (INDEPENDENT_AMBULATORY_CARE_PROVIDER_SITE_OTHER): Payer: 59 | Admitting: *Deleted

## 2014-08-06 VITALS — BP 158/80 | HR 68

## 2014-08-06 DIAGNOSIS — Z136 Encounter for screening for cardiovascular disorders: Secondary | ICD-10-CM

## 2014-08-06 DIAGNOSIS — Z0130 Encounter for examination of blood pressure without abnormal findings: Secondary | ICD-10-CM

## 2014-08-06 DIAGNOSIS — R030 Elevated blood-pressure reading, without diagnosis of hypertension: Secondary | ICD-10-CM

## 2014-08-06 DIAGNOSIS — IMO0001 Reserved for inherently not codable concepts without codable children: Secondary | ICD-10-CM

## 2014-08-06 NOTE — Progress Notes (Signed)
   Pt in nurse clinic for blood pressure check.  BP 158/80 manually heart rate 68.  Pt denies any chest pain, SOB, dizziness or visual changes.  Will forward to PCP. Clovis PuMartin, Tamika L, RN

## 2014-08-13 ENCOUNTER — Ambulatory Visit (INDEPENDENT_AMBULATORY_CARE_PROVIDER_SITE_OTHER): Payer: 59 | Admitting: *Deleted

## 2014-08-13 VITALS — BP 144/80 | HR 68

## 2014-08-13 DIAGNOSIS — Z0130 Encounter for examination of blood pressure without abnormal findings: Secondary | ICD-10-CM

## 2014-08-13 DIAGNOSIS — Z136 Encounter for screening for cardiovascular disorders: Secondary | ICD-10-CM

## 2014-08-13 NOTE — Progress Notes (Signed)
   Pt in nurse clinic for blood pressure check.  BP 144/80 manually, heart rate 68.  Pt used an interpreter for [place]: Albertson'sBoukari Saidou.  Pt stated that she is having dizzy spells that last an hour every day.  Pt advised to schedule an appt with her PCP.  Pt asked if the dizzy spells happen with/without medication; she stated it just happens at different times of the day.  Will forward to PCP.  Clovis PuMartin, Tristram Milian L, RN

## 2014-08-15 ENCOUNTER — Encounter: Payer: Self-pay | Admitting: Family Medicine

## 2014-08-15 ENCOUNTER — Ambulatory Visit (INDEPENDENT_AMBULATORY_CARE_PROVIDER_SITE_OTHER): Payer: 59 | Admitting: Family Medicine

## 2014-08-15 VITALS — BP 136/87 | HR 90 | Temp 98.6°F | Ht 63.0 in | Wt 240.0 lb

## 2014-08-15 DIAGNOSIS — R42 Dizziness and giddiness: Secondary | ICD-10-CM

## 2014-08-15 LAB — BASIC METABOLIC PANEL
BUN: 16 mg/dL (ref 6–23)
CHLORIDE: 100 meq/L (ref 96–112)
CO2: 26 meq/L (ref 19–32)
Calcium: 10.3 mg/dL (ref 8.4–10.5)
Creat: 0.72 mg/dL (ref 0.50–1.10)
GLUCOSE: 85 mg/dL (ref 70–99)
POTASSIUM: 4.1 meq/L (ref 3.5–5.3)
SODIUM: 137 meq/L (ref 135–145)

## 2014-08-15 LAB — TSH: TSH: 1.11 u[IU]/mL (ref 0.350–4.500)

## 2014-08-15 MED ORDER — MECLIZINE HCL 25 MG PO TABS
25.0000 mg | ORAL_TABLET | Freq: Three times a day (TID) | ORAL | Status: AC | PRN
Start: 2014-08-15 — End: ?

## 2014-08-15 NOTE — Progress Notes (Signed)
Patient ID: Edwyna ShellYawa Mentzel, female   DOB: 12-19-1975, 38 y.o.   MRN: [id number] Subjective:   CC: Dizziness  HPI:   38 y.o. female with h/o HLD, elevated BP, obesity here for dizziness. Feeling dizzy for 2 days. First time, lasted 1 hour and resolved spontaneously. Unable to tell if anything caused it. Yesterday, 5 minutes resolved, then again 5 minutes, then resolved. Someone told her walking would help and it did. Today, has not come back. Dizziness is when nervous, along with headaches.  Feels like room spins: Yes; NOT like prescyncope. Lightheadedness when stands: No Palpitations or heart racing: No Chest pain present when has these episodes, burning, 5/10. Prior dizziness: Has felt this in the past when pregnant. Felt just like thsi. Medications tried: No Taking blood thinners: No No bleeding. No new meds, new food, stressors, or change in dietary habits. Normal voiding and stooling. No fevers or chills.  Symptoms Hearing Loss: None Ear Pain or fullness: No Nausea or vomiting: No Vision difficulty or double vision: No Falls: No Head trauma: No Weakness in arm or leg: No Speaking problems: No Headache: Entire head, unable to describe it, 5/10, take tylenol and it goes away. Intermittent for 1 month, 2x per week.  Recent illness last month, better about 2 qweek sago. Last 2 BPs in clinic 140-150s/80s. Had been having irregular vaginal bleeding 11/24 visit.  Denied heavy bleeding at that time.   ROS see HPI Smoking Status noted  Review of Systems - Per HPI.   PMH - sickle cell trait, prior c-section, obesity, irregular menstrual bleeding, HLD, gestational HTN, AMA Smoking status: Never smoker No drugs or etoh    Objective:  Physical Exam BP 136/87 mmHg  Pulse 90  Temp(Src) 98.6 F (37 C) (Oral)  Ht 5\' 3"  (1.6 m)  Wt 240 lb (108.863 kg)  BMI 42.52 kg/m2  LMP 07/21/2014 GEN: NAD CV: RRR, no m/r/g, 2+ B Radial pulses EXTR: No LE edema or calf tenderness PULM:  CTAB, normal effort ABD: S/NT/ND NEURO: Awake, alert, oriented, no focal deficit, normal speech, CN 2-12 tested and intact    Assessment:     Edwyna ShellYawa Stebner is a 38 y.o. female here for dizziness.    Plan:     # See problem list and after visit summary for problem-specific plans.   # Health Maintenance: Not discussed today.  - High BP: Discuss with PCP.  Follow-up: Follow up in 2 weeks PRN lack of improvement in dizziness.    Leona SingletonMaria T Laurieanne Galloway, MD [facility]

## 2014-08-15 NOTE — Progress Notes (Signed)
Pt speaks English.  States "I can understand English, and I do not want and interpretor". Deborah Huang, Deborah RochesterJessica Huang

## 2014-08-15 NOTE — Progress Notes (Signed)
Patient ID: Deborah Huang, female   DOB: 08/05/1976, 38 y.o.   MRN: [id number] Discussed with Dr. Karie Schwalbe.  Agree with her management.

## 2014-08-15 NOTE — Patient Instructions (Signed)
Good to see you.  The dizziness sounds most like vertigo (room spinning sensation) and there are many causes.  We will also do labwork today and I will call if it is NOT normal. Take meclizine 25 mg three times daily AS NEEDED for dizziness. Be aware this may cause drowsiness. If this is not getting better in 2 weeks, come back for follow up. If you have chest pain, trouble breathing, or other concerns, seek immediate evaluation.  Best,  Leona SingletonMaria T Heavyn Yearsley, MD  Dizziness  Dizziness means you feel unsteady or lightheaded. You might feel like you are going to pass out (faint). HOME CARE   Drink enough fluids to keep your pee (urine) clear or pale yellow.  Take your medicines exactly as told by your doctor. If you take blood pressure medicine, always stand up slowly from the lying or sitting position. Hold on to something to steady yourself.  If you need to stand in one place for a long time, move your legs often. Tighten and relax your leg muscles.  Have someone stay with you until you feel okay.  Do not drive or use heavy machinery if you feel dizzy.  Do not drink alcohol. GET HELP RIGHT AWAY IF:   You feel dizzy or lightheaded and it gets worse.  You feel sick to your stomach (nauseous), or you throw up (vomit).  You have trouble talking or walking.  You feel weak or have trouble using your arms, hands, or legs.  You cannot think clearly or have trouble forming sentences.  You have chest pain, belly (abdominal) pain, sweating, or you are short of breath.  Your vision changes.  You are bleeding.  You have problems from your medicine that seem to be getting worse. MAKE SURE YOU:   Understand these instructions.  Will watch your condition.  Will get help right away if you are not doing well or get worse. Document Released: 08/12/2011 Document Revised: 11/15/2011 Document Reviewed: 08/12/2011 [facility] Patient Information 2015 [employer] [place]. This information is  not intended to replace advice given to you by your health care provider. Make sure you discuss any questions you have with your health care provider.

## 2014-08-15 NOTE — Assessment & Plan Note (Addendum)
DDx includes vertigo, presyncope, cardiac issue, cerebellar disease, peripheral neuropathy, med SE, metabolic derangements, or psychiatric. No neurologic abnormalities. Some headache and burning chest pain with dizziness, mild and resolved, burning nature makes cardiac unlikely. None today. No syncope. Recent A1c 5.7 making osmotic diuresis with hypotension unlikely. VSS. - TSH, CBC, BMET - hearing testing today for baseline. - Meclizine 25mg  TID PRN. Warned re: drowsiness.  - F/u 2 weeks if not improving. Sooner return precautions reviewed. - Precepted with Dr Leveda AnnaHensel.

## 2014-08-16 LAB — CBC WITH DIFFERENTIAL/PLATELET
Basophils Absolute: 0 10*3/uL (ref 0.0–0.1)
Basophils Relative: 0 % (ref 0–1)
EOS PCT: 1 % (ref 0–5)
Eosinophils Absolute: 0.1 10*3/uL (ref 0.0–0.7)
HEMATOCRIT: 37.5 % (ref 36.0–46.0)
HEMOGLOBIN: 12.4 g/dL (ref 12.0–15.0)
LYMPHS ABS: 3.2 10*3/uL (ref 0.7–4.0)
LYMPHS PCT: 42 % (ref 12–46)
MCH: 28 pg (ref 26.0–34.0)
MCHC: 33.1 g/dL (ref 30.0–36.0)
MCV: 84.7 fL (ref 78.0–100.0)
MONO ABS: 0.5 10*3/uL (ref 0.1–1.0)
MONOS PCT: 6 % (ref 3–12)
MPV: 11.1 fL (ref 9.4–12.4)
NEUTROS ABS: 3.9 10*3/uL (ref 1.7–7.7)
Neutrophils Relative %: 51 % (ref 43–77)
Platelets: 255 10*3/uL (ref 150–400)
RBC: 4.43 MIL/uL (ref 3.87–5.11)
RDW: 14.2 % (ref 11.5–15.5)
WBC: 7.6 10*3/uL (ref 4.0–10.5)

## 2014-08-20 ENCOUNTER — Encounter: Payer: Self-pay | Admitting: *Deleted

## 2014-09-02 ENCOUNTER — Encounter: Payer: Self-pay | Admitting: Family Medicine

## 2014-10-17 IMAGING — US US OB FOLLOW-UP
1 series · 12 of 28 positions shown · non-contrast
Comparison: none

[Series 1: us ob follow up · 12 of 65 slices shown]
[im 3/65]
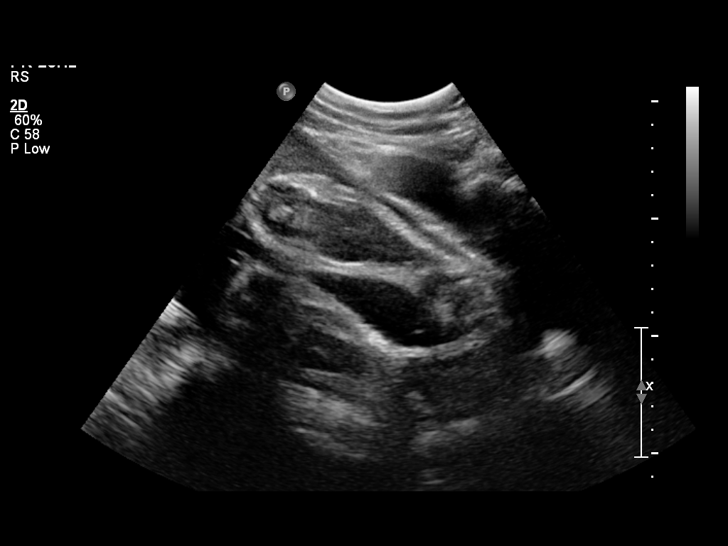
[im 8/65]
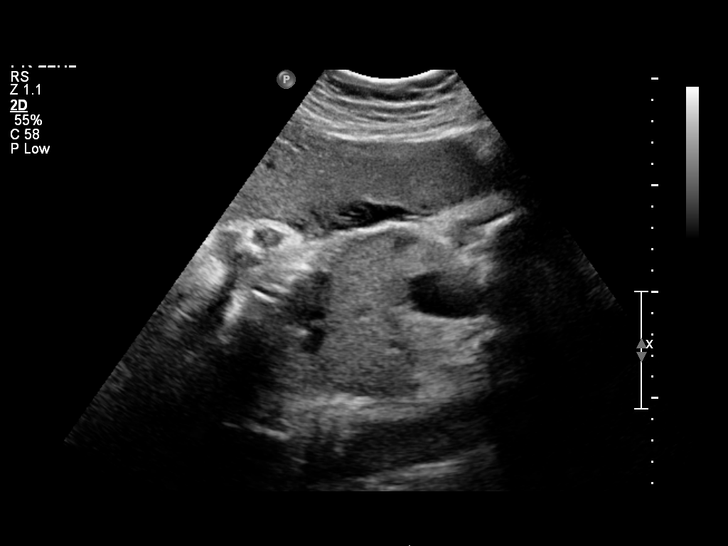
[im 12/65]
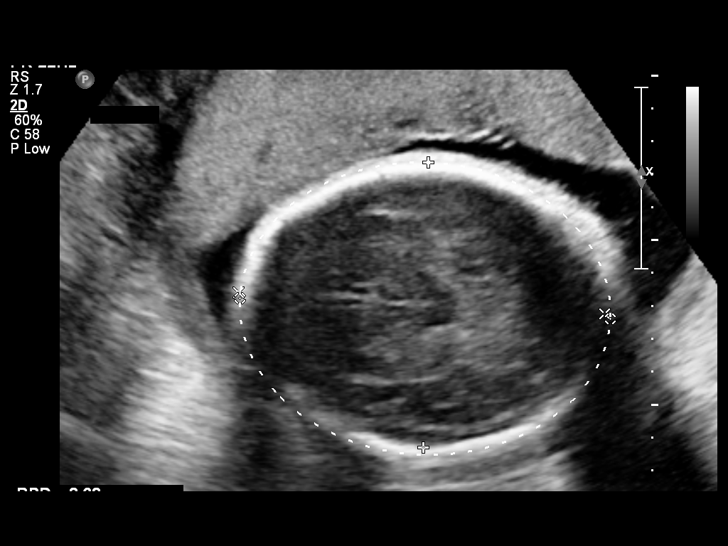
[im 19/65]
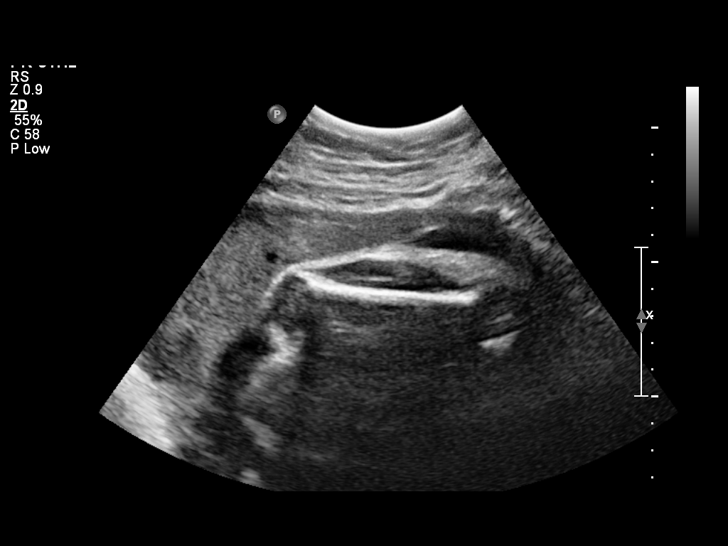
[im 24/65]
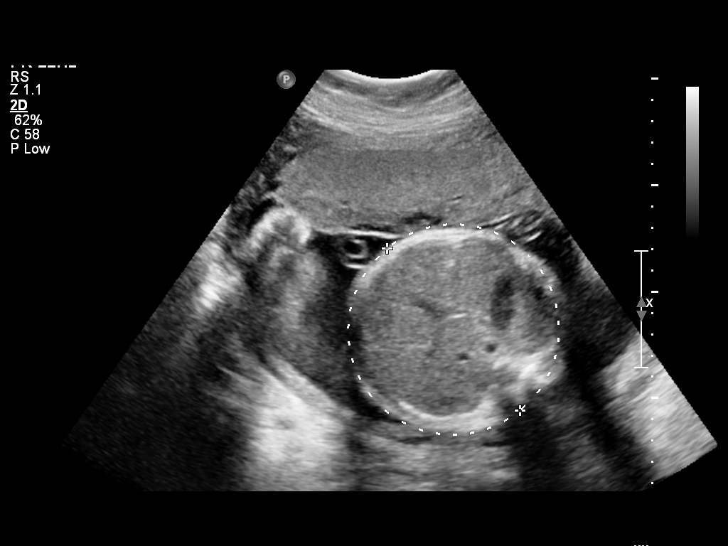
[im 29/65]
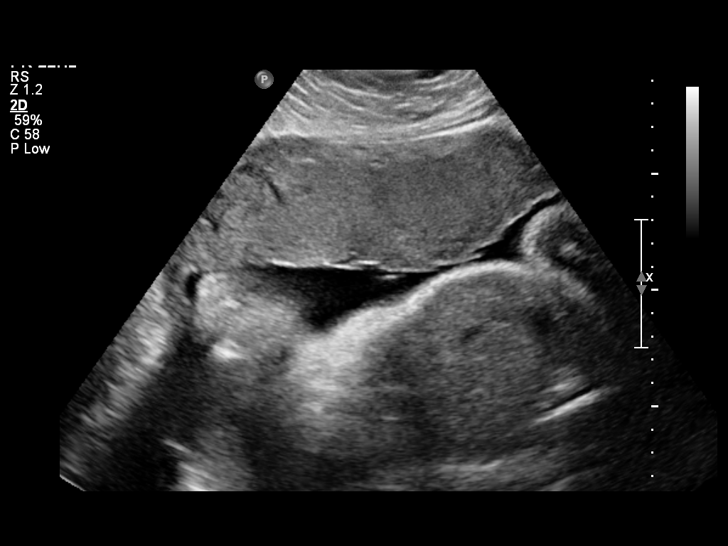
[im 36/65]
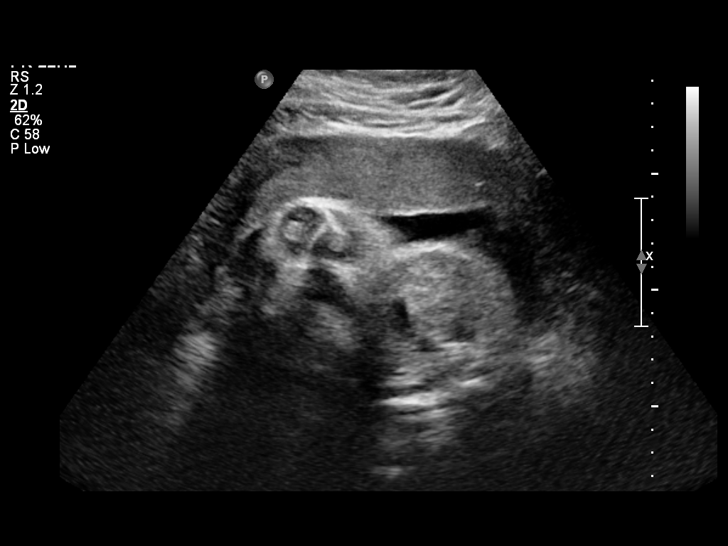
[im 41/65]
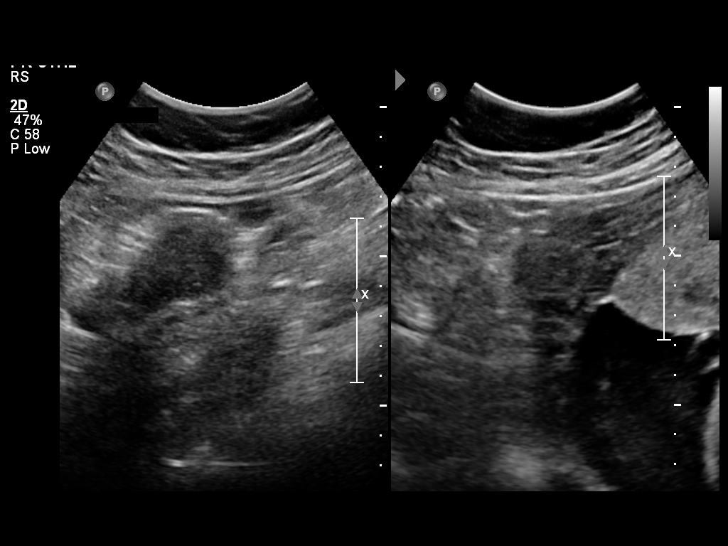
[im 46/65]
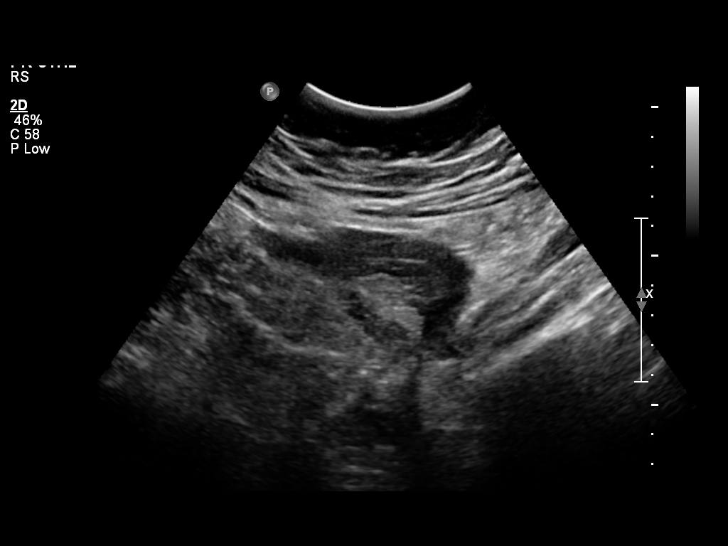
[im 53/65]
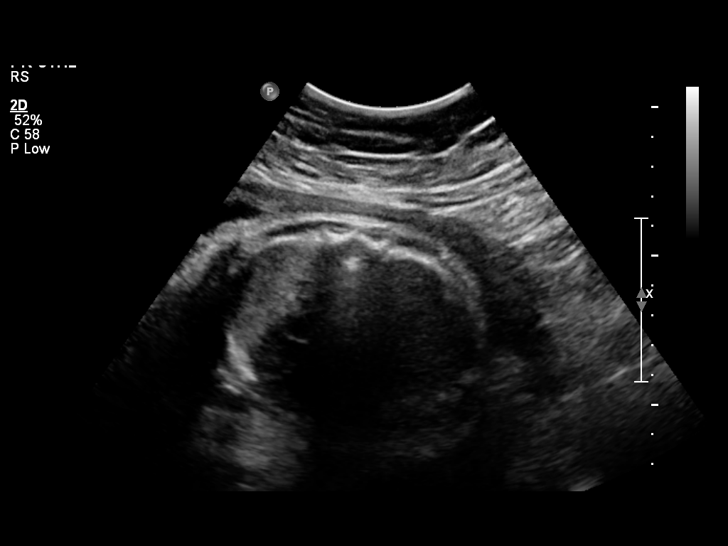
[im 57/65]
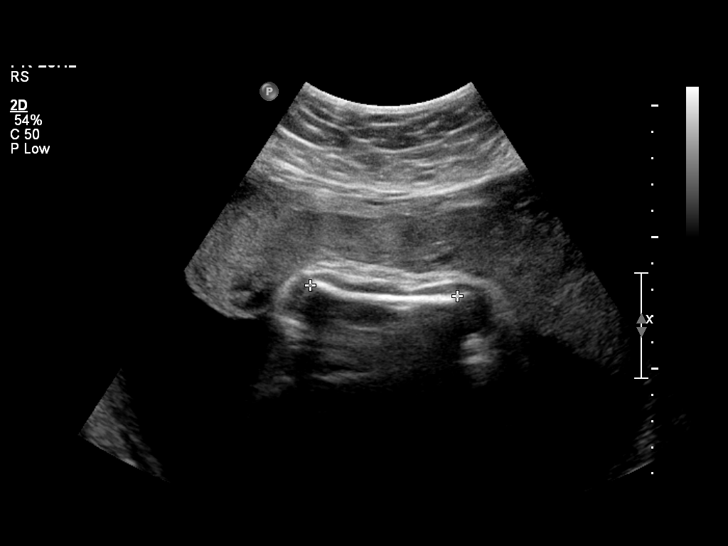
[im 62/65]
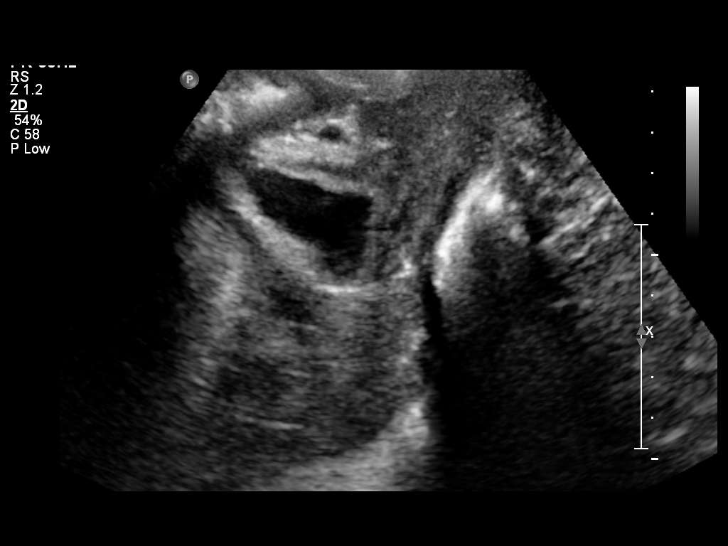

[12 of 28 positions shown; findings below may reference images not displayed]

OBSTETRICS REPORT
                      (Signed Final 10/09/2013 [DATE])

Service(s) Provided

 US OB FOLLOW UP                                       76816.1
Indications

 Cervical shortening
 Evaluate anatomy not seen on prior sonogram
 Advanced maternal age (37), Multigravida; low risk
 quad screen
 Maternal morbid obesity
 H/O PPROM and delivery - 17P
Fetal Evaluation

 Num Of Fetuses:    1
 Preg. Location:    Intrauterine
 Fetal Heart Rate:  142                          bpm
 Cardiac Activity:  Observed
 Presentation:      Breech
 Placenta:          Anterior, above cervical os
 P. Cord            Visualized, central
 Insertion:

 Amniotic Fluid
 AFI FV:      Subjectively within normal limits
 AFI Sum:     21.67   cm       83  %Tile     Larg Pckt:    7.22  cm
 RUQ:   5.81    cm   RLQ:    4.11   cm    LUQ:   7.22    cm   LLQ:    4.53   cm
Biometry

 BPD:     86.1  mm     G. Age:  34w 5d                CI:         77.7   70 - 86
 OFD:    110.8  mm                                    FL/HC:      20.2   19.1 -

 HC:     313.4  mm     G. Age:  35w 1d       89  %    HC/AC:      1.03   0.96 -

 AC:     304.6  mm     G. Age:  34w 3d       96  %    FL/BPD:     73.5   71 - 87
 FL:      63.3  mm     G. Age:  32w 5d       58  %    FL/AC:      20.8   20 - 24
 HUM:     56.1  mm     G. Age:  32w 5d       62  %

 Est. FW:    1666  gm      5 lb 2 oz     84  %
Gestational Age

 LMP:           32w 0d        Date:  02/27/13                 EDD:   12/04/13
 U/S Today:     34w 2d                                        EDD:   11/18/13
 Best:          32w 0d     Det. By:  LMP  (02/27/13)          EDD:   12/04/13
Anatomy

 Cranium:          Previously seen        Aortic Arch:      Previously seen
 Fetal Cavum:      Appears normal         Ductal Arch:      Previously seen
 Ventricles:       Previously seen        Diaphragm:        Appears normal
 Choroid Plexus:   Previously seen        Stomach:          Appears normal, left
                                                            sided
 Cerebellum:       Previously seen        Abdomen:          Previously seen
 Posterior Fossa:  Previously seen        Abdominal Wall:   Previously seen
 Nuchal Fold:      Not applicable (>20    Cord Vessels:     Appears normal (3
                   wks GA)                                  vessel cord)
 Face:             Orbits and profile     Kidneys:          Appear normal
                   previously seen
 Lips:             Previously seen        Bladder:          Appears normal
 Palate:           Not well visualized    Spine:            Appears normal
 Heart:            Appears normal         Lower             Previously seen
                   (4CH, axis, and        Extremities:
                   situs)
 RVOT:             Previously seen        Upper             Previously seen
                                          Extremities:
 LVOT:             Previously seen

 Other:  Technicallly difficult due to advanced GA and maternal habitus.
Cervix Uterus Adnexa

 Cervix:       Assessed translabially.
 Uterus:       No abnormality visualized.
 Cul De Sac:   No free fluid seen.

 Left Ovary:    Size(cm) L: 3.1 x W: 1.81 x H: 1.76  Volume(cc):
 Right Ovary:   Size(cm) L: 2.73 x W: 2.12 x H: 2.21  Volume(cc):

 Adnexa:     No adnexal mass visualized.
Impression

 IUP at 32+0 weeks
 Normal interval anatomy
 Normal amniotic fluid volume
 Appropriate interval growth with EFW at the 84th %tile
 Translabial views of cervix - uninterpretable (remote read)
Recommendations

 If CL is desired, please order endovaginal ultrasound
 Continue 17P
 Follow-up ultrasounds as clinically indicated.

 questions or concerns.
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
## 2015-07-04 LAB — GLUCOSE, POCT (MANUAL RESULT ENTRY): POC Glucose: 81 mg/dl (ref 70–99)

## 2015-12-01 ENCOUNTER — Encounter: Payer: Self-pay | Admitting: Obstetrics and Gynecology

## 2015-12-01 ENCOUNTER — Ambulatory Visit (INDEPENDENT_AMBULATORY_CARE_PROVIDER_SITE_OTHER): Payer: 59 | Admitting: Obstetrics and Gynecology

## 2015-12-01 VITALS — BP 138/78 | HR 74 | Temp 98.2°F | Ht 63.0 in | Wt 217.6 lb

## 2015-12-01 DIAGNOSIS — K219 Gastro-esophageal reflux disease without esophagitis: Secondary | ICD-10-CM | POA: Diagnosis not present

## 2015-12-01 DIAGNOSIS — IMO0001 Reserved for inherently not codable concepts without codable children: Secondary | ICD-10-CM

## 2015-12-01 DIAGNOSIS — R1010 Upper abdominal pain, unspecified: Secondary | ICD-10-CM | POA: Diagnosis not present

## 2015-12-01 DIAGNOSIS — R030 Elevated blood-pressure reading, without diagnosis of hypertension: Secondary | ICD-10-CM | POA: Diagnosis not present

## 2015-12-01 MED ORDER — OMEPRAZOLE 40 MG PO CPDR: 40.0000 mg | DELAYED_RELEASE_CAPSULE | Freq: Every day | ORAL | Status: DC

## 2015-12-01 NOTE — Assessment & Plan Note (Addendum)
Symptoms consistent with diagnosis of GERD. Increased omeprazole to 40mg  daily. Counseled to avoid spicy foods and to stay upright after eating. Handout given. Abdominal US ordered due to patient's concern that something more may be going on but have a feeling it will be benign.

## 2015-12-01 NOTE — Progress Notes (Signed)
     Subjective: Chief Complaint  Patient presents with  . Abdominal Pain    acid reflux     HPI: Deborah Huang is a 40 y.o. presenting to clinic today to discuss the following:  #ABDOMINAL PAIN -states she has been having abdominal pain for years now -recently went to an urgent care last month and was given omeprazole 20mg ; no imaging was done adn patient worried something else is going on -medication has not been helping -notices abdominal pain worse after spicy foods -states that it hurts all over her abdomen and into her chest -describes an acid/burning feeling in chest -current pain level 8/10 -Prior abdominal surgeries: 2 c-sections  Symptoms Nausea/vomiting: no Diarrhea: no Constipation: no Fever: no Dysuria: no Loss of appetite: no  # Elevated Blood pressure -not currently on medications -had gestational hypertension in pregnancy 2 years ago  Health Maintenance: Needs complete wellness exam and labs    ROS noted in HPI.  Past Medical, Surgical, Social, and Family History Reviewed & Updated per EMR.   Objective: BP 138/78 mmHg  Pulse 74  Temp(Src) 98.2 F (36.8 C) (Oral)  Ht 5\' 3"  (1.6 m)  Wt 217 lb 9.6 oz (98.703 kg)  BMI 38.56 kg/m2 Vitals and nursing notes reviewed  Physical Exam  Constitutional: She is well-developed, well-nourished, and in no distress.  Cardiovascular: Normal rate.   Pulmonary/Chest: Effort normal.  Abdominal: Soft. Normal appearance. She exhibits no distension and no mass. Bowel sounds are hypoactive. There is tenderness in the epigastric area, suprapubic area and left lower quadrant. There is no guarding.  Skin: Skin is warm and dry. No rash noted.    Assessment/Plan: Please see problem based Assessment and Plan  Health Maintainance: Will schedule appointment for complete wellness exam.    Orders Placed This Encounter  Procedures  . US Abdomen Complete    Standing Status: Future     Number of Occurrences:    Standing Expiration Date: 01/30/2017    Order Specific Question:  Reason for Exam (SYMPTOM  OR DIAGNOSIS REQUIRED)    Answer:  abdominal pain    Order Specific Question:  Preferred imaging location?    Answer:  Internal    Meds ordered this encounter  Medications  . omeprazole (PRILOSEC) 40 MG capsule    Sig: Take 1 capsule (40 mg total) by mouth daily.    Dispense:  30 capsule    Refill:  3     Caryl AdaJazma Phelps, DO 12/01/2015, 1:40 PM PGY-2, [facility]

## 2015-12-01 NOTE — Assessment & Plan Note (Signed)
BP slightly elevated and on repeat in upper normal range. H/o gestational HTN. Encouraged lifestyle modifications to help with BP control. If still elevated at next visit would consider starting patient on antihypertensive.

## 2015-12-01 NOTE — Patient Instructions (Addendum)
[place] ordered and scheduled. i will let you know the results when it comes to me Take medication daily. Increased dose to .   Gastroesophageal Reflux Disease, Adult Normally, food travels down the esophagus and stays in the stomach to be digested. However, when a person has gastroesophageal reflux disease (GERD), food and stomach acid move back up into the esophagus. When this happens, the esophagus becomes sore and inflamed. Over time, GERD can create small holes (ulcers) in the lining of the esophagus.  CAUSES This condition is caused by a problem with the muscle between the esophagus and the stomach (lower esophageal sphincter, or LES). Normally, the LES muscle closes after food passes through the esophagus to the stomach. When the LES is weakened or abnormal, it does not close properly, and that allows food and stomach acid to go back up into the esophagus. The LES can be weakened by certain dietary substances, medicines, and medical conditions, including:  Tobacco use.  Pregnancy.  Having a hiatal hernia.  Heavy alcohol use.  Certain foods and beverages, such as coffee, chocolate, onions, and peppermint. RISK FACTORS This condition is more likely to develop in:  People who have an increased body weight.  People who have connective tissue disorders.  People who use NSAID medicines. SYMPTOMS Symptoms of this condition include:  Heartburn.  Difficult or painful swallowing.  The feeling of having a lump in the throat.  Abitter taste in the mouth.  Bad breath.  Having a large amount of saliva.  Having an upset or bloated stomach.  Belching.  Chest pain.  Shortness of breath or wheezing.  Ongoing (chronic) cough or a night-time cough.  Wearing away of tooth enamel.  Weight loss. Different conditions can cause chest pain. Make sure to see your health care provider if you experience chest pain. DIAGNOSIS Your health care provider will take a medical history and  perform a physical exam. To determine if you have mild or severe GERD, your health care provider may also monitor how you respond to treatment. You may also have other tests, including:  An endoscopy toexamine your stomach and esophagus with a small camera.  A test thatmeasures the acidity level in your esophagus.  A test thatmeasures how much pressure is on your esophagus.  A barium swallow or modified barium swallow to show the shape, size, and functioning of your esophagus. TREATMENT The goal of treatment is to help relieve your symptoms and to prevent complications. Treatment for this condition may vary depending on how severe your symptoms are. Your health care provider may recommend:  Changes to your diet.  Medicine.  Surgery. HOME CARE INSTRUCTIONS Diet  Follow a diet as recommended by your health care provider. This may involve avoiding foods and drinks such as:  Coffee and tea (with or without caffeine).  Drinks that containalcohol.  Energy drinks and sports drinks.  Carbonated drinks or sodas.  Chocolate and cocoa.  Peppermint and mint flavorings.  Garlic and onions.  Horseradish.  Spicy and acidic foods, including peppers, chili powder, curry powder, vinegar, hot sauces, and barbecue sauce.  Citrus fruit juices and citrus fruits, such as oranges, lemons, and limes.  Tomato-based foods, such as red sauce, chili, salsa, and pizza with red sauce.  Fried and fatty foods, such as donuts, french fries, potato chips, and high-fat dressings.  High-fat meats, such as hot dogs and fatty cuts of red and white meats, such as rib eye steak, sausage, ham, and bacon.  High-fat dairy items, such  as whole milk, butter, and cream cheese.  Eat small, frequent meals instead of large meals.  Avoid drinking large amounts of liquid with your meals.  Avoid eating meals during the 2-3 hours before bedtime.  Avoid lying down right after you eat.  Do not exercise right  after you eat. General Instructions  Pay attention to any changes in your symptoms.  Take over-the-counter and prescription medicines only as told by your health care provider. Do not take aspirin, ibuprofen, or other NSAIDs unless your health care provider told you to do so.  Do not use any tobacco products, including cigarettes, chewing tobacco, and e-cigarettes. If you need help quitting, ask your health care provider.  Wear loose-fitting clothing. Do not wear anything tight around your waist that causes pressure on your abdomen.  Raise (elevate) the head of your bed 6 inches (15cm).  Try to reduce your stress, such as with yoga or meditation. If you need help reducing stress, ask your health care provider.  If you are overweight, reduce your weight to an amount that is healthy for you. Ask your health care provider for guidance about a safe weight loss goal.  Keep all follow-up visits as told by your health care provider. This is important. SEEK MEDICAL CARE IF:  You have new symptoms.  You have unexplained weight loss.  You have difficulty swallowing, or it hurts to swallow.  You have wheezing or a persistent cough.  Your symptoms do not improve with treatment.  You have a hoarse voice. SEEK IMMEDIATE MEDICAL CARE IF:  You have pain in your arms, neck, jaw, teeth, or back.  You feel sweaty, dizzy, or light-headed.  You have chest pain or shortness of breath.  You vomit and your vomit looks like blood or coffee grounds.  You faint.  Your stool is bloody or black.  You cannot swallow, drink, or eat.   This information is not intended to replace advice given to you by your health care provider. Make sure you discuss any questions you have with your health care provider.   Document Released: 06/02/2005 Document Revised: 05/14/2015 Document Reviewed: 12/18/2014 [employer] Interactive Patient Education [employer]

## 2015-12-05 ENCOUNTER — Ambulatory Visit (HOSPITAL_COMMUNITY)
Admission: RE | Admit: 2015-12-05 | Discharge: 2015-12-05 | Disposition: A | Discharge status: Home / Self Care | Payer: 59 | Source: Ambulatory Visit | Attending: Family Medicine | Admitting: Family Medicine

## 2015-12-05 DIAGNOSIS — R1010 Upper abdominal pain, unspecified: Secondary | ICD-10-CM | POA: Diagnosis present

## 2015-12-29 ENCOUNTER — Encounter: Payer: Self-pay | Admitting: Family Medicine

## 2015-12-29 ENCOUNTER — Ambulatory Visit (INDEPENDENT_AMBULATORY_CARE_PROVIDER_SITE_OTHER): Payer: 59 | Admitting: Family Medicine

## 2015-12-29 VITALS — BP 123/71 | HR 60 | Temp 98.9°F | Ht 63.0 in | Wt 215.6 lb

## 2015-12-29 DIAGNOSIS — K219 Gastro-esophageal reflux disease without esophagitis: Secondary | ICD-10-CM | POA: Diagnosis not present

## 2015-12-29 DIAGNOSIS — E669 Obesity, unspecified: Secondary | ICD-10-CM | POA: Diagnosis not present

## 2015-12-29 DIAGNOSIS — Z0001 Encounter for general adult medical examination with abnormal findings: Secondary | ICD-10-CM

## 2015-12-29 DIAGNOSIS — R6889 Other general symptoms and signs: Secondary | ICD-10-CM | POA: Diagnosis not present

## 2015-12-29 DIAGNOSIS — E785 Hyperlipidemia, unspecified: Secondary | ICD-10-CM | POA: Diagnosis not present

## 2015-12-29 LAB — CBC
HCT: 37.4 % (ref 35.0–45.0)
Hemoglobin: 11.9 g/dL (ref 11.7–15.5)
MCH: 27.6 pg (ref 27.0–33.0)
MCHC: 31.8 g/dL — AB (ref 32.0–36.0)
MCV: 86.8 fL (ref 80.0–100.0)
MPV: 11.5 fL (ref 7.5–12.5)
Platelets: 255 10*3/uL (ref 140–400)
RBC: 4.31 MIL/uL (ref 3.80–5.10)
RDW: 14.2 % (ref 11.0–15.0)
WBC: 4.5 10*3/uL (ref 3.8–10.8)

## 2015-12-29 LAB — POCT GLYCOSYLATED HEMOGLOBIN (HGB A1C): HEMOGLOBIN A1C: 5

## 2015-12-29 NOTE — Patient Instructions (Signed)
Health Maintenance, Female Adopting a healthy lifestyle and getting preventive care can go a long way to promote health and wellness. Talk with your health care provider about what schedule of regular examinations is right for you. This is a good chance for you to check in with your provider about disease prevention and staying healthy. In between checkups, there are plenty of things you can do on your own. Experts have done a lot of research about which lifestyle changes and preventive measures are most likely to keep you healthy. Ask your health care provider for more information. WEIGHT AND DIET  Eat a healthy diet  Be sure to include plenty of vegetables, fruits, low-fat dairy products, and lean protein.  Do not eat a lot of foods high in solid fats, added sugars, or salt.  Get regular exercise. This is one of the most important things you can do for your health.  Most adults should exercise for at least 150 minutes each week. The exercise should increase your heart rate and make you sweat (moderate-intensity exercise).  Most adults should also do strengthening exercises at least twice a week. This is in addition to the moderate-intensity exercise.  Maintain a healthy weight  Body mass index (BMI) is a measurement that can be used to identify possible weight problems. It estimates body fat based on height and weight. Your health care provider can help determine your BMI and help you achieve or maintain a healthy weight.  For females 39 years of age and older:   A BMI below 18.5 is considered underweight.  A BMI of 18.5 to 24.9 is normal.  A BMI of 25 to 29.9 is considered overweight.  A BMI of 30 and above is considered obese.  Watch levels of cholesterol and blood lipids  You should start having your blood tested for lipids and cholesterol at 39 years of age, then have this test every 5 years.  You may need to have your cholesterol levels checked more often if:  Your lipid  or cholesterol levels are high.  You are older than 39 years of age.  You are at high risk for heart disease.  CANCER SCREENING   Lung Cancer  Lung cancer screening is recommended for adults 39-66 years old who are at high risk for lung cancer because of a history of smoking.  A yearly low-dose CT scan of the lungs is recommended for people who:  Currently smoke.  Have quit within the past 15 years.  Have at least a 30-pack-year history of smoking. A pack year is smoking an average of one pack of cigarettes a day for 1 year.  Yearly screening should continue until it has been 15 years since you quit.  Yearly screening should stop if you develop a health problem that would prevent you from having lung cancer treatment.  Breast Cancer  Practice breast self-awareness. This means understanding how your breasts normally appear and feel.  It also means doing regular breast self-exams. Let your health care provider know about any changes, no matter how small.  If you are in your 20s or 30s, you should have a clinical breast exam (CBE) by a health care provider every 1-3 years as part of a regular health exam.  If you are 25 or older, have a CBE every year. Also consider having a breast X-ray (mammogram) every year.  If you have a family history of breast cancer, talk to your health care provider about genetic screening.  If you  are at high risk for breast cancer, talk to your health care provider about having an MRI and a mammogram every year.  Breast cancer gene (BRCA) assessment is recommended for women who have family members with BRCA-related cancers. BRCA-related cancers include:  Breast.  Ovarian.  Tubal.  Peritoneal cancers.  Results of the assessment will determine the need for genetic counseling and BRCA1 and BRCA2 testing. Cervical Cancer Your health care provider may recommend that you be screened regularly for cancer of the pelvic organs (ovaries, uterus, and  vagina). This screening involves a pelvic examination, including checking for microscopic changes to the surface of your cervix (Pap test). You may be encouraged to have this screening done every 3 years, beginning at age 39.  For women ages 30-65, health care providers may recommend pelvic exams and Pap testing every 3 years, or they may recommend the Pap and pelvic exam, combined with testing for human papilloma virus (HPV), every 5 years. Some types of HPV increase your risk of cervical cancer. Testing for HPV may also be done on women of any age with unclear Pap test results.  Other health care providers may not recommend any screening for nonpregnant women who are considered low risk for pelvic cancer and who do not have symptoms. Ask your health care provider if a screening pelvic exam is right for you.  If you have had past treatment for cervical cancer or a condition that could lead to cancer, you need Pap tests and screening for cancer for at least 20 years after your treatment. If Pap tests have been discontinued, your risk factors (such as having a new sexual partner) need to be reassessed to determine if screening should resume. Some women have medical problems that increase the chance of getting cervical cancer. In these cases, your health care provider may recommend more frequent screening and Pap tests. Colorectal Cancer  This type of cancer can be detected and often prevented.  Routine colorectal cancer screening usually begins at 39 years of age and continues through 39 years of age.  Your health care provider may recommend screening at an earlier age if you have risk factors for colon cancer.  Your health care provider may also recommend using home test kits to check for hidden blood in the stool.  A small camera at the end of a tube can be used to examine your colon directly (sigmoidoscopy or colonoscopy). This is done to check for the earliest forms of colorectal  cancer.  Routine screening usually begins at age 39.  Direct examination of the colon should be repeated every 5-10 years through 39 years of age. However, you may need to be screened more often if early forms of precancerous polyps or small growths are found. Skin Cancer  Check your skin from head to toe regularly.  Tell your health care provider about any new moles or changes in moles, especially if there is a change in a mole's shape or color.  Also tell your health care provider if you have a mole that is larger than the size of a pencil eraser.  Always use sunscreen. Apply sunscreen liberally and repeatedly throughout the day.  Protect yourself by wearing long sleeves, pants, a wide-brimmed hat, and sunglasses whenever you are outside. HEART DISEASE, DIABETES, AND HIGH BLOOD PRESSURE   High blood pressure causes heart disease and increases the risk of stroke. High blood pressure is more likely to develop in:  People who have blood pressure in the high end   of the normal range (130-139/85-89 mm Hg).  People who are overweight or obese.  People who are African American.  If you are 38-23 years of age, have your blood pressure checked every 3-5 years. If you are 61 years of age or older, have your blood pressure checked every year. You should have your blood pressure measured twice--once when you are at a hospital or clinic, and once when you are not at a hospital or clinic. Record the average of the two measurements. To check your blood pressure when you are not at a hospital or clinic, you can use:  An automated blood pressure machine at a pharmacy.  A home blood pressure monitor.  If you are between 45 years and 39 years old, ask your health care provider if you should take aspirin to prevent strokes.  Have regular diabetes screenings. This involves taking a blood sample to check your fasting blood sugar level.  If you are at a normal weight and have a low risk for diabetes,  have this test once every three years after 39 years of age.  If you are overweight and have a high risk for diabetes, consider being tested at a younger age or more often. PREVENTING INFECTION  Hepatitis B  If you have a higher risk for hepatitis B, you should be screened for this virus. You are considered at high risk for hepatitis B if:  You were born in a country where hepatitis B is common. Ask your health care provider which countries are considered high risk.  Your parents were born in a high-risk country, and you have not been immunized against hepatitis B (hepatitis B vaccine).  You have HIV or AIDS.  You use needles to inject street drugs.  You live with someone who has hepatitis B.  You have had sex with someone who has hepatitis B.  You get hemodialysis treatment.  You take certain medicines for conditions, including cancer, organ transplantation, and autoimmune conditions. Hepatitis C  Blood testing is recommended for:  Everyone born from [birth year] through 1964.  Anyone with known risk factors for hepatitis C. Sexually transmitted infections (STIs)  You should be screened for sexually transmitted infections (STIs) including gonorrhea and chlamydia if:  You are sexually active and are younger than 39 years of age.  You are older than 39 years of age and your health care provider tells you that you are at risk for this type of infection.  Your sexual activity has changed since you were last screened and you are at an increased risk for chlamydia or gonorrhea. Ask your health care provider if you are at risk.  If you do not have HIV, but are at risk, it may be recommended that you take a prescription medicine daily to prevent HIV infection. This is called pre-exposure prophylaxis (PrEP). You are considered at risk if:  You are sexually active and do not regularly use condoms or know the HIV status of your partner(s).  You take drugs by injection.  You are sexually  active with a partner who has HIV. Talk with your health care provider about whether you are at high risk of being infected with HIV. If you choose to begin PrEP, you should first be tested for HIV. You should then be tested every 3 months for as long as you are taking PrEP.  PREGNANCY   If you are premenopausal and you may become pregnant, ask your health care provider about preconception counseling.  If you may  become pregnant, take 400 to 800 micrograms (mcg) of folic acid every day.  If you want to prevent pregnancy, talk to your health care provider about birth control (contraception). OSTEOPOROSIS AND MENOPAUSE   Osteoporosis is a disease in which the bones lose minerals and strength with aging. This can result in serious bone fractures. Your risk for osteoporosis can be identified using a bone density scan.  If you are 61 years of age or older, or if you are at risk for osteoporosis and fractures, ask your health care provider if you should be screened.  Ask your health care provider whether you should take a calcium or vitamin D supplement to lower your risk for osteoporosis.  Menopause may have certain physical symptoms and risks.  Hormone replacement therapy may reduce some of these symptoms and risks. Talk to your health care provider about whether hormone replacement therapy is right for you.  HOME CARE INSTRUCTIONS   Schedule regular health, dental, and eye exams.  Stay current with your immunizations.   Do not use any tobacco products including cigarettes, chewing tobacco, or electronic cigarettes.  If you are pregnant, do not drink alcohol.  If you are breastfeeding, limit how much and how often you drink alcohol.  Limit alcohol intake to no more than 1 drink per day for nonpregnant women. One drink equals 12 ounces of beer, 5 ounces of wine, or 1 ounces of hard liquor.  Do not use street drugs.  Do not share needles.  Ask your health care provider for help if  you need support or information about quitting drugs.  Tell your health care provider if you often feel depressed.  Tell your health care provider if you have ever been abused or do not feel safe at home.   This information is not intended to replace advice given to you by your health care provider. Make sure you discuss any questions you have with your health care provider.   Document Released: 03/08/2011 Document Revised: 09/13/2014 Document Reviewed: 07/25/2013 Elsevier Interactive Patient Education [employer].

## 2015-12-29 NOTE — Addendum Note (Signed)
Addended by: Pamelia HoitBLOUNT, DESEREE C on: 12/29/2015 09:45 AM   Modules accepted: Orders

## 2015-12-29 NOTE — Assessment & Plan Note (Signed)
Pain resolved with PPI. Reviewed abdominal US: Negative.  - Advised continuing PPI for another month then stopping. F/u with PCP if pain returns

## 2015-12-29 NOTE — Progress Notes (Signed)
  Patient name: Deborah Huang MRN [id number]  Date of birth: 1975/12/09  CC & HPI:  Deborah Huang is a 40 y.o. female presenting today for healthcare maintenance visit. She does not have any complaints today. Reports previous abdominal pain has resolved and she continues to take PPI daily. Has had her tubes tied. Continues to have regular menses. Does not smoke. Denies CP, SOB, palpitations. Does endorse excessive thirst and urination with nocturia x 2 times a night. NO Fhx of breast CA.    Smoking History Noted  Objective Findings:  Vitals: BP 123/71 mmHg  Pulse 60  Temp(Src) 98.9 F (37.2 C) (Oral)  Ht 5\' 3"  (1.6 m)  Wt 215 lb 9.6 oz (97.796 kg)  BMI 38.20 kg/m2  SpO2 100%  LMP 12/08/2015  Gen: NAD; Obese CV: RRR w/o m/r/g, pulses +2 b/l Resp: CTAB w/ normal respiratory effort  Assessment & Plan:   Esophageal reflux Pain resolved with PPI. Reviewed abdominal US: Negative.  - Advised continuing PPI for another month then stopping. F/u with PCP if pain returns  Obesity Check A1c, Lipids

## 2015-12-29 NOTE — Assessment & Plan Note (Signed)
Check A1c, Lipids

## 2015-12-30 ENCOUNTER — Encounter: Payer: Self-pay | Admitting: Family Medicine

## 2015-12-30 LAB — LIPID PANEL
CHOL/HDL RATIO: 3.5 ratio (ref <=5.0)
Cholesterol: 190 mg/dL (ref 125–200)
HDL: 54 mg/dL (ref >=46)
LDL Cholesterol: 122 mg/dL (ref <130)
Triglycerides: 69 mg/dL (ref <150)
VLDL: 14 mg/dL (ref <30)

## 2015-12-30 LAB — BASIC METABOLIC PANEL
BUN: 14 mg/dL (ref 7–25)
CO2: 28 mmol/L (ref 20–31)
CREATININE: 0.75 mg/dL (ref 0.50–1.10)
Calcium: 9.6 mg/dL (ref 8.6–10.2)
Chloride: 102 mmol/L (ref 98–110)
Glucose, Bld: 85 mg/dL (ref 65–99)
POTASSIUM: 4.2 mmol/L (ref 3.5–5.3)
Sodium: 138 mmol/L (ref 135–146)

## 2016-06-17 LAB — GLUCOSE, POCT (MANUAL RESULT ENTRY): POC Glucose: 101 mg/dl — AB (ref 70–99)

## 2017-02-10 ENCOUNTER — Ambulatory Visit (INDEPENDENT_AMBULATORY_CARE_PROVIDER_SITE_OTHER): Payer: 59 | Admitting: Obstetrics and Gynecology

## 2017-02-10 ENCOUNTER — Encounter: Payer: Self-pay | Admitting: Obstetrics and Gynecology

## 2017-02-10 VITALS — BP 128/62 | HR 72 | Temp 98.0°F | Resp 20 | Ht 63.0 in | Wt 227.6 lb

## 2017-02-10 DIAGNOSIS — K219 Gastro-esophageal reflux disease without esophagitis: Secondary | ICD-10-CM

## 2017-02-10 MED ORDER — OMEPRAZOLE 40 MG PO CPDR: 40.0000 mg | DELAYED_RELEASE_CAPSULE | Freq: Every day | ORAL | 0 refills | Status: AC

## 2017-02-10 MED ORDER — FAMOTIDINE 20 MG PO TABS: 20.0000 mg | ORAL_TABLET | Freq: Every day | ORAL | 3 refills | Status: AC | PRN

## 2017-02-10 NOTE — Progress Notes (Signed)
     Subjective: Chief Complaint  Patient presents with  . Abdominal Pain     HPI: Deborah Huang is a 41 y.o. presenting to clinic today to discuss the following:  #ABDOMINAL PAIN Has had abdominal pain for over a year Comes and goes Gets worse with and after eating Describes it as a stabbing pain Generalized pain Was on a medication before that helped when she had this before but no longer has any Medications tried: none Similar pain before: yes, feels similar Prior abdominal surgeries: c-section x2 Sexually active with same partner for quite some time.   Contraception: tubal ligation Patient's last menstrual period was 02/04/2017 (exact date). Last BM: daily  Symptoms Nausea/vomiting: no Anorexia: no Diarrhea: no Constipation: no Blood in stool:no Fever: no Dysuria: no Vaginal bleeding or discharge: no Loss of appetite: no Weight loss: no  ROS noted in HPI.   Past Medical, Surgical, Social, and Family History Reviewed & Updated per EMR.   Pertinent Historical Findings include: sickle cell trait   History  Smoking Status  . Never Smoker  Smokeless Tobacco  . Never Used    Objective: BP 128/62 (BP Location: Right Arm, Patient Position: Sitting, Cuff Size: Large)   Pulse 72   Temp 98 F (36.7 C) (Oral)   Resp 20   Ht 5\' 3"  (1.6 m)   Wt 227 lb 9.6 oz (103.2 kg)   LMP 02/04/2017 (Exact Date)   SpO2 97%   BMI 40.32 kg/m  Vitals and nursing notes reviewed  Physical Exam  Constitutional: She is well-developed, well-nourished, and in no distress.  HENT:  Head: Normocephalic and atraumatic.  Eyes: Conjunctivae and EOM are normal.  Neck: Normal range of motion.  Cardiovascular: Normal rate.   Pulmonary/Chest: Effort normal.  Abdominal: Soft. Bowel sounds are normal. She exhibits no distension. There is no tenderness. There is no guarding.  Neurological: She is alert.  Psychiatric: Mood and affect normal.    Assessment/Plan: Please see problem  based Assessment and Plan PATIENT EDUCATION PROVIDED: See AVS    Diagnosis and plan along with any newly prescribed medication(s) were discussed in detail with this patient today. The patient verbalized understanding and agreed with the plan.   Meds ordered this encounter  Medications  . omeprazole (PRILOSEC) 40 MG capsule    Sig: Take 1 capsule (40 mg total) by mouth daily.    Dispense:  30 capsule    Refill:  0  . famotidine (PEPCID) 20 MG tablet    Sig: Take 1 tablet (20 mg total) by mouth daily as needed for heartburn or indigestion.    Dispense:  30 tablet    Refill:  3     Caryl AdaJazma Arilla Hice, DO 02/10/2017, 9:44 AM PGY-3, [facility]

## 2017-02-10 NOTE — Patient Instructions (Addendum)
   Medications sent to pharmacy  Take the omeprazole for a month and then switch to the pepcid as needed for symptoms  Letter given  Make an appointment for 2 weeks for physcial exam

## 2017-02-14 NOTE — Assessment & Plan Note (Signed)
Known diagnosis of GERD. Was treated with PPI with resolution of symptoms last time. Rx given for omeprazole. Patient to complete 30 day course and then will switch to Pepcid to see if this will help control her symptoms. Discussed dietary changes and avoiding laying down at least 2 hours after eating. Return to clinic if symptoms worsen or not improved with therapy.

## 2017-02-21 ENCOUNTER — Ambulatory Visit (INDEPENDENT_AMBULATORY_CARE_PROVIDER_SITE_OTHER): Payer: 59 | Admitting: Obstetrics and Gynecology

## 2017-02-21 ENCOUNTER — Encounter: Payer: Self-pay | Admitting: Obstetrics and Gynecology

## 2017-02-21 VITALS — BP 126/64 | HR 74 | Temp 98.0°F | Ht 63.0 in | Wt 228.0 lb

## 2017-02-21 DIAGNOSIS — Z0000 Encounter for general adult medical examination without abnormal findings: Secondary | ICD-10-CM | POA: Diagnosis not present

## 2017-02-21 DIAGNOSIS — Z6841 Body Mass Index (BMI) 40.0 and over, adult: Secondary | ICD-10-CM | POA: Diagnosis not present

## 2017-02-21 DIAGNOSIS — IMO0001 Reserved for inherently not codable concepts without codable children: Secondary | ICD-10-CM

## 2017-02-21 DIAGNOSIS — E669 Obesity, unspecified: Secondary | ICD-10-CM | POA: Diagnosis not present

## 2017-02-21 DIAGNOSIS — E785 Hyperlipidemia, unspecified: Secondary | ICD-10-CM

## 2017-02-21 NOTE — Patient Instructions (Addendum)
Things to do to Keep yourself Healthy - Exercise at least 30-45 minutes a day,  3-4 days a week.  - Eat a low-fat diet with lots of fruits and vegetables, up to 7-9 servings per day. - Seatbelts can save your life. Wear them always. - Smoke detectors on every level of your home, check batteries every year. - Eye Doctor - have an eye exam every 1-2 years - Safe sex - if you may be exposed to STDs, use a condom. - Alcohol If you drink, do it moderately,less than 2 drinks per day. - Health Care Power of Attorney.  Choose someone to speak for you if you are not able. - Depression is common in our stressful world.If you're feeling down or losing interest in things you normally enjoy, please come in for a visit. - Violence - If anyone is threatening or hurting you, please call immediately.  Will contact you about blood work and picking up form

## 2017-02-21 NOTE — Assessment & Plan Note (Signed)
H/o hyperlipidemia. Not currently on medications. Working on lifestyle modifications. Patient with low ASCVD risk. Will recheck lipid panel.

## 2017-02-21 NOTE — Progress Notes (Signed)
41 y.o. year old female presents for well woman/preventative visit  Chief Complaint  Patient presents with  . Annual Exam   Acute Concerns: none  Diet: well-balanced   Exercise: 30min walking 3 days a week   Sexual History: married and sexually active  Birth history: [id number]  LMP: Patient's last menstrual period was 02/04/2017 (exact date).  Birth Control: tubal ligation  POA/Living Will: No  Social:  Social History   Social History  . Marital status: Married    Spouse name: N/A  . Number of children: N/A  . Years of education: N/A   Occupational History  . Braid hair    Social History Main Topics  . Smoking status: Never Smoker  . Smokeless tobacco: Never Used  . Alcohol use No  . Drug use: No  . Sexual activity: Yes    Birth control/ protection: None, Surgical   Other Topics Concern  . None   Social History Narrative  . None    There are no preventive care reminders to display for this patient.  ROS reviewed and were negative unless otherwise noted in HPI.   Physical Exam: BP 126/64   Pulse 74   Temp 98 F (36.7 C) (Oral)   Ht 5\' 3"  (1.6 m)   Wt 228 lb (103.4 kg)   LMP 02/04/2017 (Exact Date)   SpO2 99%   BMI 40.39 kg/m   General: alert, well-developed, NAD, cooperative HEENT: NCAT, vision grossly intact, PERRLA, no injection and anicteric. EOMI. MMM, oral mucosa and oropharynx reveal no lesions or exudates.  Ears: External ear exam reveals no significant lesions or deformities.  Nose: External nasal exam reveals no deformity or inflammation.  Neck: supple, full ROM, no thyromegaly. No deformities, masses, or tenderness noted.  Lungs: CTAB, normal respiratory effort, no accessory muscle use, no crackles, and no wheezes. Heart: RRR, no M/R/G.  Abdomen: Bowel sounds normal; abdomen soft and mild epigastric tenderness. No masses, organomegaly or hernias noted. no guarding, no rebound tenderness Extremities: No cyanosis, clubbing,  edema Neurologic: No focal deficits, +5 strength globally, sensation grossly intact, gait normal, A&Ox3.  Skin: Intact without suspicious lesions or rashes. Warm and dry. Psych: Mood and affect are normal; no evidence of anxiety or depression.  ASSESSMENT & PLAN: 41 y.o. female presents for annual well woman/preventative exam. Please see problem specific assessment and plan.     Caryl AdaJazma Phelps, DO 02/21/2017, 3:02 PM PGY-3, [facility]

## 2017-02-21 NOTE — Assessment & Plan Note (Addendum)
Healthy adult female. No co-morbidities. Up to date on preventative care. Physical forms completed for patient.

## 2017-02-21 NOTE — Assessment & Plan Note (Signed)
Will check glucose. Discussed losing weight and exercising more. Continue to monitor.

## 2017-02-22 ENCOUNTER — Other Ambulatory Visit (INDEPENDENT_AMBULATORY_CARE_PROVIDER_SITE_OTHER): Payer: 59

## 2017-02-22 DIAGNOSIS — E785 Hyperlipidemia, unspecified: Secondary | ICD-10-CM

## 2017-02-22 DIAGNOSIS — Z0000 Encounter for general adult medical examination without abnormal findings: Secondary | ICD-10-CM | POA: Diagnosis not present

## 2017-02-22 LAB — GLUCOSE, POCT (MANUAL RESULT ENTRY): POC Glucose: 108 mg/dl — AB (ref 70–99)

## 2017-02-23 ENCOUNTER — Encounter: Payer: Self-pay | Admitting: Obstetrics and Gynecology

## 2017-02-23 LAB — LIPID PANEL
CHOL/HDL RATIO: 3 ratio (ref 0.0–4.4)
Cholesterol, Total: 182 mg/dL (ref 100–199)
HDL: 61 mg/dL (ref >39)
LDL CALC: 110 mg/dL — AB (ref 0–99)
TRIGLYCERIDES: 57 mg/dL (ref 0–149)
VLDL Cholesterol Cal: 11 mg/dL (ref 5–40)

## 2017-02-25 ENCOUNTER — Telehealth: Payer: Self-pay | Admitting: *Deleted

## 2017-02-25 NOTE — Telephone Encounter (Signed)
Patient is aware that forms are ready for pick up. Kensington Rios,CMA

## 2017-02-25 NOTE — Telephone Encounter (Signed)
-----   Message from Pincus LargeJazma Y Phelps, DO sent at 02/25/2017  9:25 AM EDT ----- Please inform patient that her paperwork with results are ready for pick up. Thanks!

## 2017-07-11 ENCOUNTER — Ambulatory Visit: Payer: 59 | Admitting: Family Medicine

## 2017-07-12 ENCOUNTER — Other Ambulatory Visit (HOSPITAL_COMMUNITY)
Admission: RE | Admit: 2017-07-12 | Discharge: 2017-07-12 | Disposition: A | Discharge status: Home / Self Care | Payer: 59 | Source: Ambulatory Visit | Attending: Family Medicine | Admitting: Family Medicine

## 2017-07-12 ENCOUNTER — Encounter: Payer: Self-pay | Admitting: Family Medicine

## 2017-07-12 ENCOUNTER — Ambulatory Visit (INDEPENDENT_AMBULATORY_CARE_PROVIDER_SITE_OTHER): Payer: 59 | Admitting: Family Medicine

## 2017-07-12 VITALS — BP 138/70 | HR 68 | Temp 98.2°F | Wt 225.0 lb

## 2017-07-12 DIAGNOSIS — Z124 Encounter for screening for malignant neoplasm of cervix: Secondary | ICD-10-CM

## 2017-07-12 DIAGNOSIS — R350 Frequency of micturition: Secondary | ICD-10-CM

## 2017-07-12 DIAGNOSIS — R1030 Lower abdominal pain, unspecified: Secondary | ICD-10-CM

## 2017-07-12 DIAGNOSIS — Z23 Encounter for immunization: Secondary | ICD-10-CM

## 2017-07-12 LAB — POCT URINALYSIS DIP (MANUAL ENTRY)
Bilirubin, UA: NEGATIVE
Blood, UA: NEGATIVE
GLUCOSE UA: NEGATIVE mg/dL
Ketones, POC UA: NEGATIVE mg/dL
Leukocytes, UA: NEGATIVE
NITRITE UA: NEGATIVE
Protein Ur, POC: NEGATIVE mg/dL
Spec Grav, UA: 1.02 (ref 1.010–1.025)
UROBILINOGEN UA: 0.2 U/dL
pH, UA: 6 (ref 5.0–8.0)

## 2017-07-12 LAB — POCT URINE PREGNANCY: Preg Test, Ur: NEGATIVE

## 2017-07-12 LAB — POCT WET PREP (WET MOUNT)
Clue Cells Wet Prep Whiff POC: NEGATIVE
Trichomonas Wet Prep HPF POC: ABSENT

## 2017-07-12 LAB — POCT GLYCOSYLATED HEMOGLOBIN (HGB A1C): HEMOGLOBIN A1C: 4.8

## 2017-07-12 NOTE — Assessment & Plan Note (Signed)
Hemoglobin A1c obtained today is 4.8 and patient has no history of having diabetes.  Patient reports that she has been drinking more water, which is likely the cause of her increased urinary frequency.  UA negative for signs of infection.  Urine culture collected and pending.

## 2017-07-12 NOTE — Assessment & Plan Note (Signed)
Patient nontender on abdominal exam.  Negative pregnancy test.  UA negative for signs of infection.  Bimanual exam and Pap smear performed, as patient was due for her regular Pap smear.  Patient exhibited cervical motion tenderness as well as some pain during the bimanual exam.  Pap cytology pending.  G/C pending.  Wet prep negative for clue cells, yeast cells, trichomonas, and positive for moderate bacteria. Likely fibroids given patient's history and findings on exam, however given 2 months of pain and physical exam findings will obtain an ultrasound.  [facility], at 2:45 PM.

## 2017-07-12 NOTE — Patient Instructions (Addendum)
Thank you for coming to see me today. It was a pleasure! Today we talked about:   Your abdominal pain. We have collected tests to determine if you are having a urine infection. Your urine pregnancy test was negative. Your urine analysis appeared clear, but we have a culture pending. [facility] to determine another cause of your pain and discomfort.  If you have any questions or concerns, please do not hesitate to call the office at [phone].  Take Care,   [place] Mary Hockey, DO

## 2017-07-12 NOTE — Progress Notes (Signed)
Subjective:    Patient ID: Deborah Huang, female    DOB: [birth year], 41 y.o.   MRN: [id number]   CC: Lower abdominal pain  HPI:  Lower Abdominal Pain - Patient reporting 2 months of lower abdominal pain. Reports it feels as if it coming from the inside and middle of her. Patient reports that the pain does not radiate anywhere.  Reports that nothing makes it better and only sex exacerbates her pain. Reports that it is worse before she pees.  Patient states that her discomfort feels similar to a when she was pregnant and babies were kicking her.  Reports that it is not a crampy pain and this is the only way she knows how to describe it. -Last period was normal for her and was 04/25/2017. Reports that she had her tubes "tied" 3 years ago after her last child was born.  Patient has 4 children at home. -Patient [place] from [place] and reports that she was told she had uterine fibroids 15 years ago. -Patient denies any discharge or abnormal bleeding. -Patient reporting pain with sex.  Denies nausea, vomiting, constipation, diarrhea.    Polyuria: -Patient reports that for the last 2 months she has been using the bathroom more often.  She reports that she goes at least 10 times during the day and 4 times during the night.  She reports that she has been drinking more water per her doctor's order.  She is concerned that she might have diabetes.  -Denies blurry vision, unexplained weight loss. -Patient reports that she does have some burning on urination.  Smoking status reviewed  Review of Systems  Per HPI, else denies recent illness, fever, headache, changes in vision, N/V/D, weakness   Patient Active Problem List   Diagnosis Date Noted  . Urinary frequency 07/12/2017  . Lower abdominal pain of unknown etiology 07/12/2017  . Healthcare maintenance 02/21/2017  . Esophageal reflux 12/01/2015  . Hyperlipidemia 08/05/2014  . Irregular menstrual bleeding 07/31/2014  . Obesity 07/31/2014  .  Language barrier, speaks [place] 07/16/2013  . Sickle cell trait (HCC) 07/16/2013     Objective:  BP 138/70   Pulse 68   Temp 98.2 F (36.8 C) (Oral)   Wt 225 lb (102.1 kg)   LMP 06/25/2017   SpO2 98%   BMI 39.86 kg/m  Vitals and nursing note reviewed  Results for orders placed or performed in visit on 07/12/17 (from the past 48 hour(s))  POCT urine pregnancy     Status: None   Collection Time: 07/12/17  9:39 AM  Result Value Ref Range   Preg Test, Ur Negative Negative  POCT urinalysis dipstick     Status: None   Collection Time: 07/12/17  9:39 AM  Result Value Ref Range   Color, UA yellow yellow   Clarity, UA clear clear   Glucose, UA negative negative mg/dL   Bilirubin, UA negative negative   Ketones, POC UA negative negative mg/dL   Spec Grav, UA 4.0981.020 1.1911.010 - 1.025   Blood, UA negative negative   pH, UA 6.0 5.0 - 8.0   Protein Ur, POC negative negative mg/dL   Urobilinogen, UA 0.2 0.2 or 1.0 E.U./dL   Nitrite, UA Negative Negative   Leukocytes, UA Negative Negative  HgB A1c     Status: None   Collection Time: 07/12/17 10:30 AM  Result Value Ref Range   Hemoglobin A1C 4.8   POCT Wet Prep [facility]([place])     Status: Abnormal  Collection Time: 07/12/17 10:30 AM  Result Value Ref Range   Source Wet Prep POC VAG    WBC, Wet Prep HPF POC 1-5    Bacteria Wet Prep HPF POC Moderate (A) Few   Clue Cells Wet Prep HPF POC None None   Clue Cells Wet Prep Whiff POC Negative Whiff    Yeast Wet Prep HPF POC None    Trichomonas Wet Prep HPF POC Absent Absent    General: NAD, pleasant Cardiac: RRR, normal heart sounds. 2+ radial and PT pulses bilaterally Respiratory: normal effort Abdomen: soft, nontender, nondistended, no hepatic or splenomegaly. Bowel sounds present Extremities: no edema or cyanosis. WWP. Skin: warm and dry, no rashes noted Neuro: alert and oriented, no focal deficits GYN:  External genitalia within normal limits.  Vaginal mucosa pink, moist, normal rugae.   Nonfriable cervix without lesions, no discharge noted, no bleeding from cervical os noted on speculum exam.  Bimanual exam revealed enlarged, tender uterus. + for cervical motion tenderness. No adnexal masses bilaterally.  Assessment & Plan:    Lower abdominal pain of unknown etiology Patient nontender on abdominal exam.  Negative pregnancy test.  UA negative for signs of infection.  Bimanual exam and Pap smear performed, as patient was due for her regular Pap smear.  Patient exhibited cervical motion tenderness as well as some pain during the bimanual exam.  Pap cytology pending.  G/C pending.  Wet prep negative for clue cells, yeast cells, trichomonas, and positive for moderate bacteria. Likely fibroids given patient's history and findings on exam, however given 2 months of pain and physical exam findings will obtain an ultrasound.  Patient scheduled for transvaginal and pelvic [facility] tomorrow, 11/7, at 2:45 PM.    Urinary frequency Hemoglobin A1c obtained today is 4.8 and patient has no history of having diabetes.  Patient reports that she has been drinking more water, which is likely the cause of her increased urinary frequency.  UA negative for signs of infection.  Urine culture collected and pending.    [place] Toya Palacios, DO Family Medicine Resident PGY-1

## 2017-07-13 ENCOUNTER — Ambulatory Visit (HOSPITAL_COMMUNITY)
Admission: RE | Admit: 2017-07-13 | Discharge: 2017-07-13 | Disposition: A | Discharge status: Home / Self Care | Payer: 59 | Source: Ambulatory Visit | Attending: Family Medicine | Admitting: Family Medicine

## 2017-07-13 DIAGNOSIS — N838 Other noninflammatory disorders of ovary, fallopian tube and broad ligament: Secondary | ICD-10-CM | POA: Insufficient documentation

## 2017-07-13 DIAGNOSIS — N852 Hypertrophy of uterus: Secondary | ICD-10-CM | POA: Insufficient documentation

## 2017-07-13 DIAGNOSIS — R1030 Lower abdominal pain, unspecified: Secondary | ICD-10-CM | POA: Diagnosis present

## 2017-07-13 NOTE — Progress Notes (Signed)
Patient called.  Patient aware of the results from her US. Patient informed to return to the office if her lower abdominal symptoms persist or worsen.

## 2017-07-14 LAB — URINE CULTURE: Organism ID, Bacteria: NO GROWTH

## 2017-07-15 LAB — CYTOLOGY - PAP
ADEQUACY: ABSENT
Chlamydia: NEGATIVE
DIAGNOSIS: NEGATIVE
HPV: NOT DETECTED
Neisseria Gonorrhea: NEGATIVE

## 2018-03-10 ENCOUNTER — Other Ambulatory Visit: Payer: Self-pay | Admitting: Obstetrics and Gynecology

## 2018-06-19 ENCOUNTER — Other Ambulatory Visit: Payer: Self-pay

## 2018-06-26 LAB — CYTOLOGY - PAP: Diagnosis: NEGATIVE

## 2019-11-07 ENCOUNTER — Other Ambulatory Visit: Payer: Self-pay | Admitting: Family Medicine

## 2019-11-07 DIAGNOSIS — Z1231 Encounter for screening mammogram for malignant neoplasm of breast: Secondary | ICD-10-CM

## 2019-12-21 ENCOUNTER — Other Ambulatory Visit: Payer: Self-pay

## 2019-12-21 DIAGNOSIS — Z1231 Encounter for screening mammogram for malignant neoplasm of breast: Secondary | ICD-10-CM

## 2020-01-22 ENCOUNTER — Ambulatory Visit: Payer: 59

## 2020-01-22 ENCOUNTER — Ambulatory Visit: Payer: Self-pay

## 2020-02-14 ENCOUNTER — Other Ambulatory Visit: Payer: Self-pay

## 2020-02-14 ENCOUNTER — Ambulatory Visit
Admission: RE | Admit: 2020-02-14 | Discharge: 2020-02-14 | Disposition: A | Discharge status: Home / Self Care | Payer: No Typology Code available for payment source | Source: Ambulatory Visit | Attending: Obstetrics and Gynecology | Admitting: Obstetrics and Gynecology

## 2020-02-14 ENCOUNTER — Ambulatory Visit: Payer: Self-pay | Admitting: *Deleted

## 2020-02-14 VITALS — BP 134/76 | Temp 98.4°F | Wt 227.7 lb

## 2020-02-14 DIAGNOSIS — Z1231 Encounter for screening mammogram for malignant neoplasm of breast: Secondary | ICD-10-CM

## 2020-02-14 DIAGNOSIS — Z124 Encounter for screening for malignant neoplasm of cervix: Secondary | ICD-10-CM

## 2020-02-14 DIAGNOSIS — Z01419 Encounter for gynecological examination (general) (routine) without abnormal findings: Secondary | ICD-10-CM

## 2020-02-14 NOTE — Patient Instructions (Signed)
Explained breast self awareness with Deborah Huang. Let her know [facility] will cover Pap smears and HPV typing every 5 years unless has a history of abnormal Pap smears. Referred patient to the [facility] for a screening mammogram. Appointment scheduled Thursday, February 14, 2020 at 0900 on the mobile unit. Patient aware of appointment and will be there. Let patient know will follow up with her within the next couple weeks with results of her Pap smear by letter or phone. Informed patient that the Breast Center will follow-up with her within the next couple of weeks with results of her mammogram by letter or phone. Sabino Dick Kobler verbalized understanding.  [place], Kathaleen Maser, RN 8:44 AM

## 2020-02-14 NOTE — Progress Notes (Signed)
Ms. Pariss Hommes is a 44 y.o. [phone] female who presents to [facility] clinic today with no complaints.   Pap Smear: Pap smear completed today. Last Pap smear was 06/19/2018 at the free cervical cancer screening clinic sponsored by the [facility] and was normal. Per patient has no history of an abnormal Pap smear. Last Pap smear result was in Epic.   Physical exam: Breasts Breasts symmetrical. No skin abnormalities bilateral breasts. No nipple retraction bilateral breasts. No nipple discharge bilateral breasts. No lymphadenopathy. No lumps palpated bilateral breasts. No complaints of pain or tenderness on exam.       Pelvic/Bimanual Ext Genitalia No lesions, no swelling and no discharge observed on external genitalia.        Vagina Vagina pink and normal texture. No lesions and small amount of whitish colored discharge observed in vagina.    Cervix Cervix is present. Cervix pink and of normal texture. No discharge observed.    Uterus Uterus is present and palpable. Uterus in normal position and normal size.        Adnexae Bilateral ovaries present and palpable. No tenderness on palpation.         Rectovaginal No rectal exam completed today since patient had no rectal complaints. No skin abnormalities observed on exam.     Smoking History: Patient has never smoked.   Patient Navigation: Patient education provided. Access to services provided for patient through [facility] program. [place] interpreter Bedelia Person from [facility] provided.   Breast and Cervical Cancer Risk Assessment: Patient has no family history of breast cancer, known genetic mutations, or radiation treatment to the chest before age 50. Patient has no history of cervical dysplasia, immunocompromised, or DES exposure in-utero.  Risk Assessment    Risk Scores      02/14/2020   Last edited by: Priscille Heidelberg, RN   5-year risk: 0.5 %   Lifetime risk: 7.4 %         A: [facility] exam with pap  smear  P: Referred patient to the [facility] for a screening mammogram. Appointment scheduled Thursday, February 14, 2020 at 0900 on the mobile unit.  Priscille Heidelberg, RN 02/14/2020 8:44 AM

## 2020-02-15 LAB — CYTOLOGY - PAP
Adequacy: ABSENT
Comment: NEGATIVE
Diagnosis: NEGATIVE
High risk HPV: NEGATIVE

## 2020-02-21 ENCOUNTER — Telehealth: Payer: Self-pay

## 2020-02-21 NOTE — Telephone Encounter (Signed)
Called patient to give pap smear results via [facility] [employer] [phone]. Informed patient that pap result was negative with negative HPV and that her next pap smear will be due in 5 years. Patient voiced understanding.

## 2021-02-21 IMAGING — MG DIGITAL SCREENING BILAT W/ TOMO W/ CAD
8 series · 8 of 24 positions shown · non-contrast
Comparison: None.

CLINICAL DATA: Screening. This is the patient's initial baseline
mammogram.

EXAM:
DIGITAL SCREENING BILATERAL MAMMOGRAM WITH TOMO AND CAD

[L MLO synth-2D]
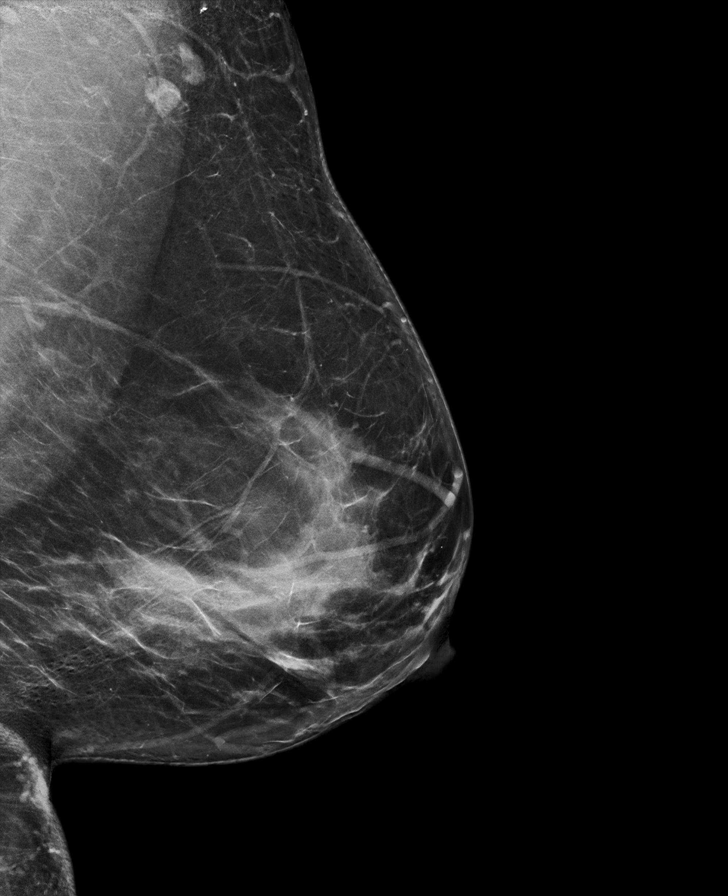

[R MLO synth-2D]
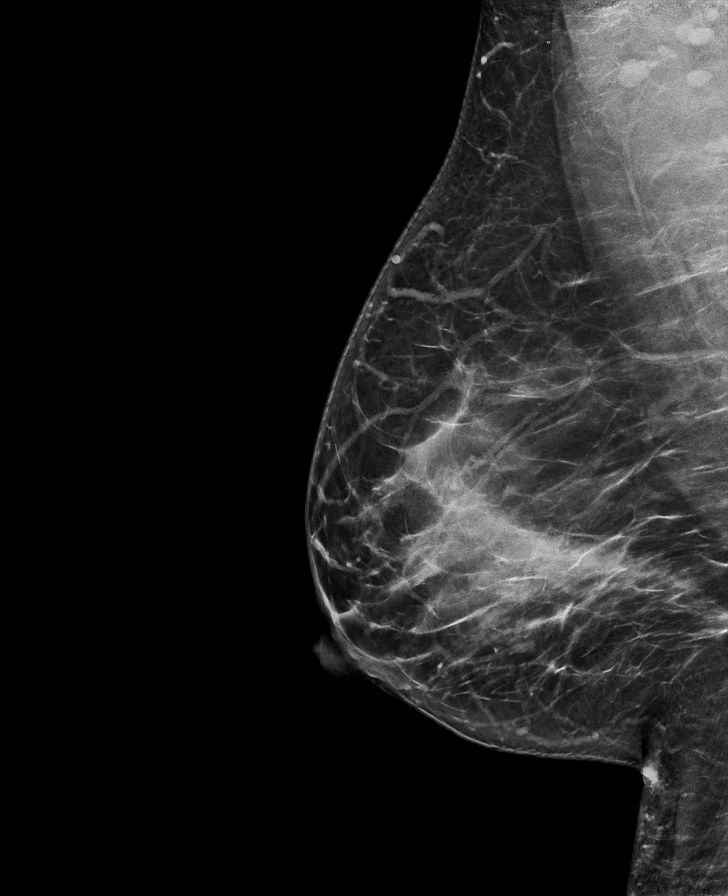

[L CC synth-2D]
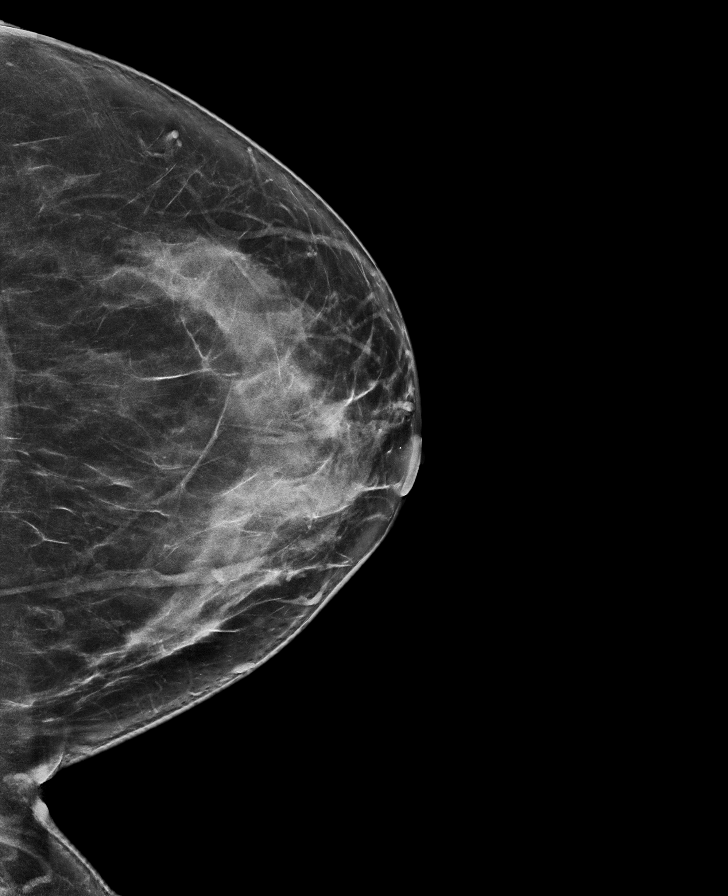

[R CC synth-2D]
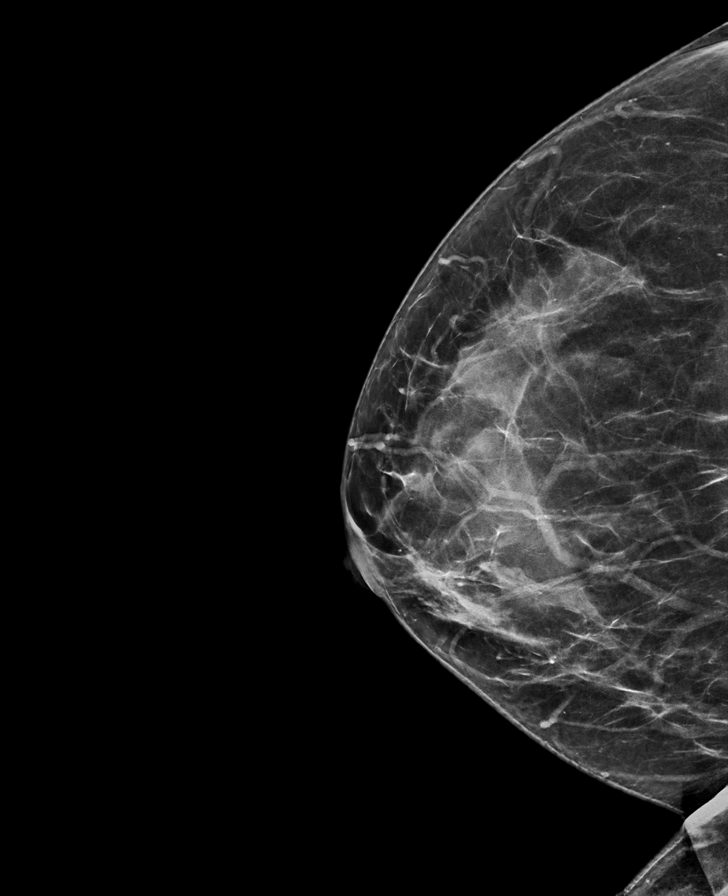

[R CC tomo · tomo slice 34/67.0]
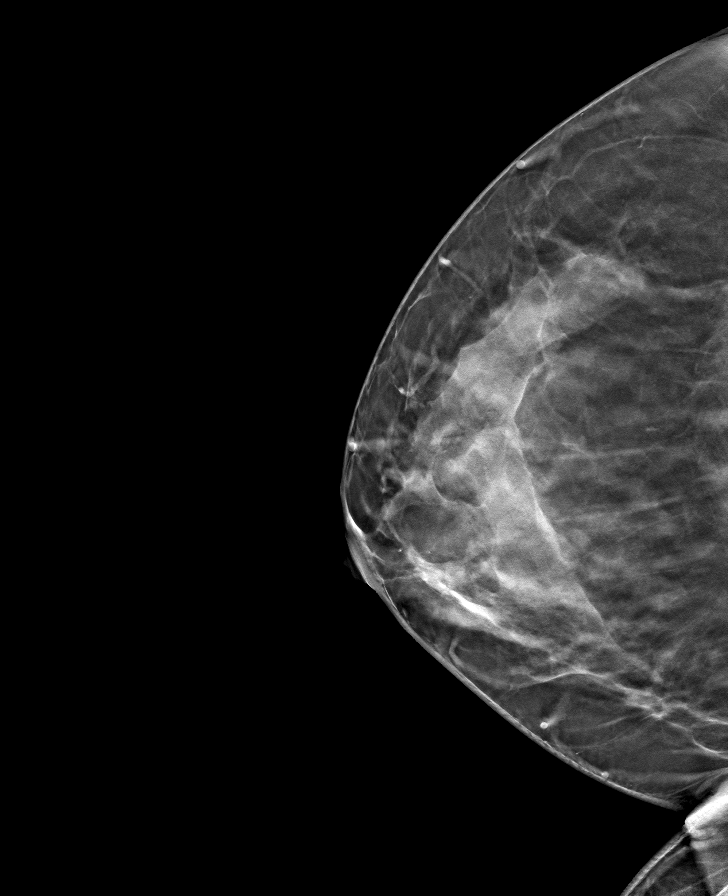

[R MLO tomo · tomo slice 39/78.0]
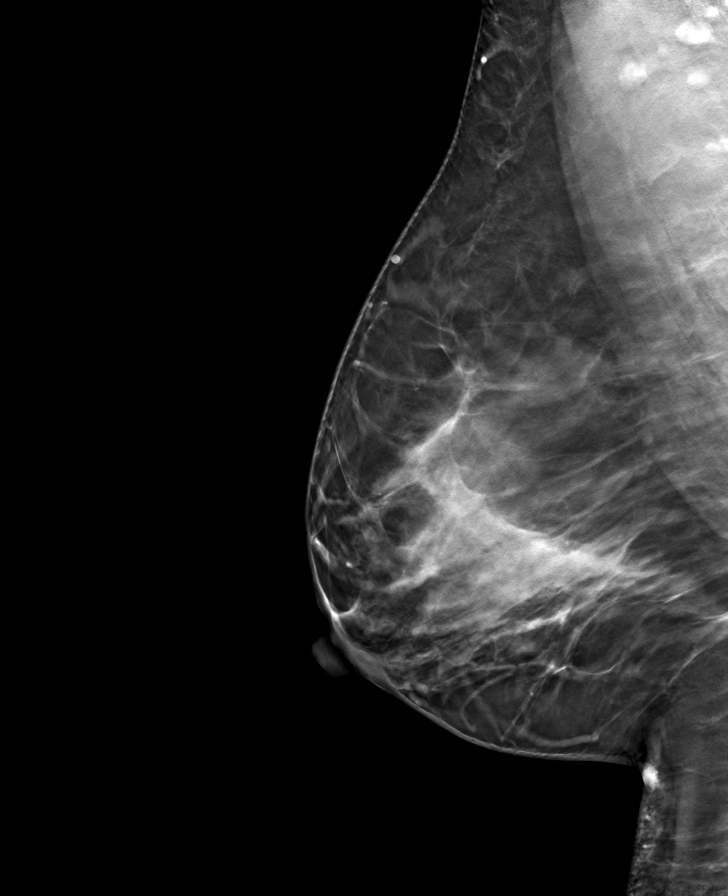

[L MLO tomo · tomo slice 41/80.0]
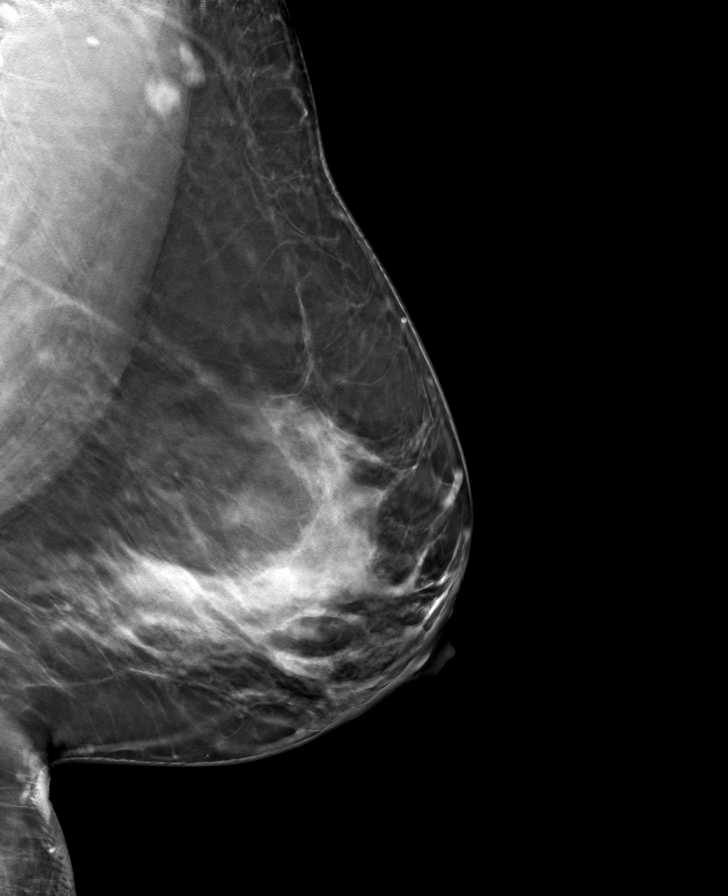

[L CC tomo · tomo slice 37/73.0]
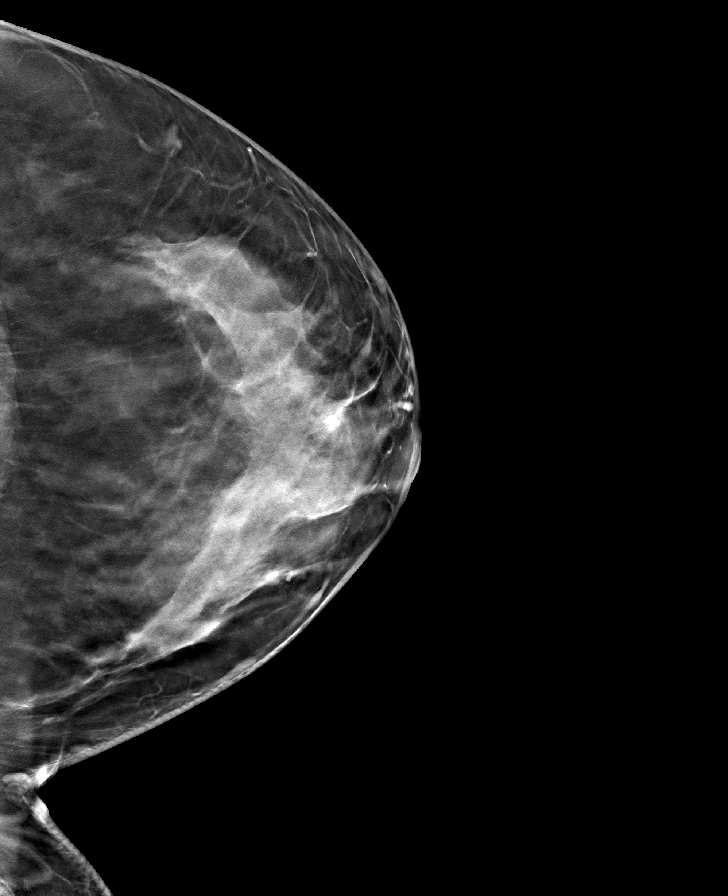

[8 of 24 positions shown; findings below may reference images not displayed]

ACR Breast Density Category c: The breast tissue is heterogeneously
dense, which may obscure small masses
FINDINGS: There are no findings suspicious for malignancy. Images were
processed with CAD.
IMPRESSION: No mammographic evidence of malignancy. A result letter of this
screening mammogram will be mailed directly to the patient.

RECOMMENDATION:
Screening mammogram in one year. (Code:XQ-N-FMR)

BI-RADS CATEGORY  1: Negative.

## 2021-11-02 ENCOUNTER — Other Ambulatory Visit: Payer: Self-pay | Admitting: Physician Assistant

## 2021-11-20 ENCOUNTER — Telehealth: Payer: Self-pay

## 2021-11-20 NOTE — Telephone Encounter (Signed)
Used [employer] interpreter # [phone] for call on 11/20/2021 ?

## 2021-11-20 NOTE — Telephone Encounter (Signed)
Telephoned patient at mobile and home number. Left a message with [facility] contact information. ?

## 2023-03-17 ENCOUNTER — Other Ambulatory Visit (HOSPITAL_BASED_OUTPATIENT_CLINIC_OR_DEPARTMENT_OTHER): Payer: Self-pay

## 2023-03-17 DIAGNOSIS — G4700 Insomnia, unspecified: Secondary | ICD-10-CM

## 2023-05-05 ENCOUNTER — Encounter: Payer: Self-pay | Admitting: Gastroenterology

## 2023-05-05 ENCOUNTER — Encounter: Payer: BLUE CROSS/BLUE SHIELD | Admitting: Gastroenterology

## 2023-05-25 NOTE — Progress Notes (Signed)
This encounter was created in error - please disregard.
